# Patient Record
Sex: Female | Born: 1955 | Race: White | Hispanic: No | Smoking: Former smoker
Health system: Southern US, Community
[De-identification: ages and names within clinical notes are randomized; demographics above are authoritative.]

## PROBLEM LIST (undated history)

## (undated) DIAGNOSIS — N6459 Other signs and symptoms in breast: Secondary | ICD-10-CM

## (undated) DIAGNOSIS — T7840XA Allergy, unspecified, initial encounter: Secondary | ICD-10-CM

## (undated) DIAGNOSIS — D649 Anemia, unspecified: Secondary | ICD-10-CM

## (undated) DIAGNOSIS — I10 Essential (primary) hypertension: Secondary | ICD-10-CM

## (undated) HISTORY — PX: OTHER SURGICAL HISTORY: SHX169

## (undated) HISTORY — DX: Anemia, unspecified: D64.9

## (undated) HISTORY — DX: Allergy, unspecified, initial encounter: T78.40XA

---

## 1997-03-18 ENCOUNTER — Ambulatory Visit (HOSPITAL_COMMUNITY): Admission: RE | Admit: 1997-03-18 | Discharge: 1997-03-18 | Payer: Self-pay | Admitting: Obstetrics and Gynecology

## 1998-04-09 ENCOUNTER — Encounter: Payer: Self-pay | Admitting: Obstetrics and Gynecology

## 1998-04-09 ENCOUNTER — Ambulatory Visit (HOSPITAL_COMMUNITY): Admission: RE | Admit: 1998-04-09 | Discharge: 1998-04-09 | Payer: Self-pay | Admitting: Obstetrics and Gynecology

## 1999-04-17 ENCOUNTER — Encounter: Admission: RE | Admit: 1999-04-17 | Discharge: 1999-04-17 | Payer: Self-pay | Admitting: Obstetrics and Gynecology

## 1999-04-17 ENCOUNTER — Encounter: Payer: Self-pay | Admitting: Obstetrics and Gynecology

## 2000-06-17 ENCOUNTER — Encounter: Admission: RE | Admit: 2000-06-17 | Discharge: 2000-06-17 | Payer: Self-pay | Admitting: Obstetrics and Gynecology

## 2000-06-17 ENCOUNTER — Encounter: Payer: Self-pay | Admitting: Obstetrics and Gynecology

## 2000-07-22 ENCOUNTER — Encounter: Admission: RE | Admit: 2000-07-22 | Discharge: 2000-07-22 | Payer: Self-pay | Admitting: Obstetrics and Gynecology

## 2000-07-22 ENCOUNTER — Encounter: Payer: Self-pay | Admitting: Obstetrics and Gynecology

## 2000-07-22 ENCOUNTER — Other Ambulatory Visit: Admission: RE | Admit: 2000-07-22 | Discharge: 2000-07-22 | Payer: Self-pay | Admitting: Radiology

## 2000-07-22 HISTORY — PX: BREAST CYST ASPIRATION: SHX578

## 2001-01-30 ENCOUNTER — Encounter: Payer: Self-pay | Admitting: Obstetrics and Gynecology

## 2001-01-30 ENCOUNTER — Encounter: Admission: RE | Admit: 2001-01-30 | Discharge: 2001-01-30 | Payer: Self-pay | Admitting: Obstetrics and Gynecology

## 2001-08-11 ENCOUNTER — Encounter: Admission: RE | Admit: 2001-08-11 | Discharge: 2001-08-11 | Payer: Self-pay | Admitting: Obstetrics and Gynecology

## 2001-08-11 ENCOUNTER — Encounter: Payer: Self-pay | Admitting: Obstetrics and Gynecology

## 2002-01-25 ENCOUNTER — Other Ambulatory Visit: Admission: RE | Admit: 2002-01-25 | Discharge: 2002-01-25 | Payer: Self-pay | Admitting: Obstetrics and Gynecology

## 2002-08-17 ENCOUNTER — Encounter: Admission: RE | Admit: 2002-08-17 | Discharge: 2002-08-17 | Payer: Self-pay | Admitting: Obstetrics and Gynecology

## 2002-08-17 ENCOUNTER — Encounter: Payer: Self-pay | Admitting: Obstetrics and Gynecology

## 2003-09-13 ENCOUNTER — Encounter: Admission: RE | Admit: 2003-09-13 | Discharge: 2003-09-13 | Payer: Self-pay | Admitting: Obstetrics and Gynecology

## 2004-03-06 ENCOUNTER — Encounter: Admission: RE | Admit: 2004-03-06 | Discharge: 2004-03-06 | Payer: Self-pay | Admitting: Obstetrics and Gynecology

## 2004-09-25 ENCOUNTER — Encounter: Admission: RE | Admit: 2004-09-25 | Discharge: 2004-09-25 | Payer: Self-pay | Admitting: Obstetrics and Gynecology

## 2005-02-19 ENCOUNTER — Encounter (INDEPENDENT_AMBULATORY_CARE_PROVIDER_SITE_OTHER): Payer: Self-pay | Admitting: *Deleted

## 2005-02-19 ENCOUNTER — Ambulatory Visit (HOSPITAL_COMMUNITY): Admission: RE | Admit: 2005-02-19 | Discharge: 2005-02-19 | Payer: Self-pay | Admitting: Obstetrics and Gynecology

## 2005-10-08 ENCOUNTER — Encounter: Admission: RE | Admit: 2005-10-08 | Discharge: 2005-10-08 | Payer: Self-pay | Admitting: Obstetrics and Gynecology

## 2006-10-14 ENCOUNTER — Encounter: Admission: RE | Admit: 2006-10-14 | Discharge: 2006-10-14 | Payer: Self-pay | Admitting: Obstetrics & Gynecology

## 2007-10-20 ENCOUNTER — Encounter: Admission: RE | Admit: 2007-10-20 | Discharge: 2007-10-20 | Payer: Self-pay | Admitting: Obstetrics & Gynecology

## 2008-10-30 ENCOUNTER — Encounter: Admission: RE | Admit: 2008-10-30 | Discharge: 2008-10-30 | Payer: Self-pay | Admitting: Obstetrics & Gynecology

## 2009-11-18 ENCOUNTER — Encounter: Admission: RE | Admit: 2009-11-18 | Discharge: 2009-11-18 | Payer: Self-pay | Admitting: Obstetrics & Gynecology

## 2010-05-29 NOTE — Op Note (Signed)
NAMESUMMERLYNN, Audrey Glover         ACCOUNT NO.:  [id number]   MEDICAL RECORD NO.:  [id number]          PATIENT TYPE:  AMB   LOCATION:  SDC                           FACILITY:  [facility]   PHYSICIAN:  Richardean Sale, M.D.   DATE OF BIRTH:  [birth year]   DATE OF PROCEDURE:  02/20/2005  DATE OF DISCHARGE:                                 OPERATIVE REPORT   PREOPERATIVE DIAGNOSES:  1.  Menorrhagia.  2.  Submucosal fibroid.   POSTOPERATIVE DIAGNOSES:  1.  Menorrhagia.  2.  Submucosal fibroid.  3.  Endometrial polyp.   OPERATION/PROCEDURE:  1.  Hysteroscopy.  2.  Dilatation and curettage.  3.  Resection of endometrial polyp and submucosal fibroid.   SURGEON:  Richardean Sale, M.D.   ASSISTANT:  None.   ANESTHESIA:  Conscious sedation with paracervical block.   ESTIMATED BLOOD LOSS:  Minimal.   FLUID DEFICIT:  80 mL.   FINDINGS:  Enlarged intracavitary submucosal fibroid approximately 2.5 cm in  diameter and a small, less than 0.5 cm endometrial polyp originating from  the right cornua.  Uterine sound length of 8 cm.   SPECIMENS:  Endometrial polyp, submucosal fibroid and endometrial curettings  sent to pathology.   INDICATIONS:  This is a 55 year old white female who has a progressive  history of worsening menses with increase in flow.  The patient was found to  be anemic secondary to her menstrual flow.  Ultrasound showed an  intracavitary submucosal fibroid.  The patient was placed on Lupron for  three months in an effort to increase her hgb and presented today for  hysteroscopy and dilatation and curettage with removal of the submucosal  fibroids.  Prior to the receipt of the risks, benefits and alternatives of  the procedure reviewed with the patient in detail, we discussed risks which  include but are not limited to hemorrhage requiring transfusion, infection,  injury to the uterus, such as uterine perforation which could cause injury  to the bowel or bladder which would  require additional surgery, risks of  anesthesia, DVT, possibility requiring hysterectomy in the future.  Also the  risk of fluid overload and electrolyte imbalance.  The patient voiced  understanding of all these risks and agreed to proceed.  Informed consent  was obtained before proceeding to the OR.   DESCRIPTION OF PROCEDURE:  The patient was taken to the operating room where  she was given conscious sedation.  She was then prepped and draped in the  usual sterile fashion and placed in the dorsal lithotomy position.  Bimanual  exam was performed.  The uterus was midline, mobile, nontender, normal size.  Adnexa were normal.  Red rubber catheter was then used to drain the bladder.  A speculum was placed in the vagina and the cervix was grasped with a single-  tooth tenaculum.  A paracervical block was then administered using a total  of 20 mL of 1% Nesacaine.  The uterus then sounded to 8 cm.  The cervix was  then very gently dilated up to 31-French and the resectoscope was then  introduced.  Inspection of the uterine cavity revealed the findings  as noted  above.  The double-loop wire was used to resect the fibroid in multiple  fragments. These were removed and sent to pathology labeled as uterine  fibroid.  The endometrial polyp was noted to originate from the right  cornua. This was removed also using the resectoscope and was sent to  pathology labeled as endometrial polyp.  Once resection was complete, the  hysteroscope was removed. This was followed by curettage to remove any  additional loose fragments and this specimen was then sent to pathology and  labeled as endometrial curettings.  The hysteroscope was then reintroduced.  There was no evidence of any active bleeding and the uterine cavity was now  clear of any fibroid or polyp fragments.  At this point the procedure was  then terminated.  The single-tooth tenaculum was removed.  There was no  bleeding coming from the tenaculum  site.  There was minimal bleeding coming  from the cervical os.  Speculum and all instruments were removed from the  patient's vagina.  The patient tolerated the procedure very well.  She was  taken out of the dorsal lithotomy position and was taken to the recovery  room awake and in stable condition.  There were no complications.  All  sponge, lap, needle, and instrument counts were correct x2.      Richardean Sale, M.D.  Electronically Signed     JW/MEDQ  D:  02/19/2005  T:  02/20/2005  Job:  [id number]

## 2010-12-01 ENCOUNTER — Other Ambulatory Visit: Payer: Self-pay | Admitting: Obstetrics & Gynecology

## 2010-12-01 DIAGNOSIS — Z1231 Encounter for screening mammogram for malignant neoplasm of breast: Secondary | ICD-10-CM

## 2010-12-11 ENCOUNTER — Other Ambulatory Visit: Payer: Self-pay | Admitting: Obstetrics & Gynecology

## 2010-12-11 ENCOUNTER — Ambulatory Visit
Admission: RE | Admit: 2010-12-11 | Discharge: 2010-12-11 | Disposition: A | Discharge status: Home / Self Care | Payer: Managed Care, Other (non HMO) | Source: Ambulatory Visit | Attending: Obstetrics & Gynecology | Admitting: Obstetrics & Gynecology

## 2010-12-11 DIAGNOSIS — Z1231 Encounter for screening mammogram for malignant neoplasm of breast: Secondary | ICD-10-CM

## 2010-12-11 DIAGNOSIS — Z79890 Hormone replacement therapy (postmenopausal): Secondary | ICD-10-CM

## 2010-12-16 ENCOUNTER — Other Ambulatory Visit: Payer: Self-pay | Admitting: Obstetrics & Gynecology

## 2010-12-16 DIAGNOSIS — R928 Other abnormal and inconclusive findings on diagnostic imaging of breast: Secondary | ICD-10-CM

## 2010-12-30 ENCOUNTER — Ambulatory Visit
Admission: RE | Admit: 2010-12-30 | Discharge: 2010-12-30 | Disposition: A | Discharge status: Home / Self Care | Payer: Managed Care, Other (non HMO) | Source: Ambulatory Visit | Attending: Obstetrics & Gynecology | Admitting: Obstetrics & Gynecology

## 2010-12-30 DIAGNOSIS — R928 Other abnormal and inconclusive findings on diagnostic imaging of breast: Secondary | ICD-10-CM

## 2011-01-07 ENCOUNTER — Inpatient Hospital Stay: Admission: RE | Admit: 2011-01-07 | Payer: Managed Care, Other (non HMO) | Source: Ambulatory Visit

## 2011-01-10 ENCOUNTER — Ambulatory Visit (INDEPENDENT_AMBULATORY_CARE_PROVIDER_SITE_OTHER): Payer: Managed Care, Other (non HMO)

## 2011-01-10 DIAGNOSIS — R509 Fever, unspecified: Secondary | ICD-10-CM

## 2011-01-10 DIAGNOSIS — R05 Cough: Secondary | ICD-10-CM

## 2011-12-31 ENCOUNTER — Other Ambulatory Visit: Payer: Self-pay | Admitting: Obstetrics & Gynecology

## 2011-12-31 DIAGNOSIS — Z1231 Encounter for screening mammogram for malignant neoplasm of breast: Secondary | ICD-10-CM

## 2012-01-27 ENCOUNTER — Ambulatory Visit
Admission: RE | Admit: 2012-01-27 | Discharge: 2012-01-27 | Disposition: A | Discharge status: Home / Self Care | Payer: Private Health Insurance - Indemnity | Source: Ambulatory Visit | Attending: Obstetrics & Gynecology | Admitting: Obstetrics & Gynecology

## 2012-01-27 DIAGNOSIS — Z1231 Encounter for screening mammogram for malignant neoplasm of breast: Secondary | ICD-10-CM

## 2012-08-11 ENCOUNTER — Ambulatory Visit (INDEPENDENT_AMBULATORY_CARE_PROVIDER_SITE_OTHER): Payer: Managed Care, Other (non HMO) | Admitting: Physician Assistant

## 2012-08-11 VITALS — BP 146/94 | HR 83 | Temp 98.1°F | Resp 16 | Ht 65.5 in | Wt 149.8 lb

## 2012-08-11 DIAGNOSIS — J329 Chronic sinusitis, unspecified: Secondary | ICD-10-CM

## 2012-08-11 DIAGNOSIS — R0981 Nasal congestion: Secondary | ICD-10-CM

## 2012-08-11 DIAGNOSIS — J3489 Other specified disorders of nose and nasal sinuses: Secondary | ICD-10-CM

## 2012-08-11 MED ORDER — AMOXICILLIN-POT CLAVULANATE 875-125 MG PO TABS: 1.0000 | ORAL_TABLET | Freq: Two times a day (BID) | ORAL | Status: DC

## 2012-08-11 MED ORDER — FLUTICASONE PROPIONATE 50 MCG/ACT NA SUSP: 2.0000 | Freq: Every day | NASAL | Status: DC

## 2012-08-11 NOTE — Progress Notes (Signed)
  Subjective:    Patient ID: Audrey Glover, female    DOB: [birth year], 57 y.o.   MRN: [id number]  HPI 57 year old female presents with 1 month hx of sinus congestion, pain, headache, thick nasal discharge, PND, and slight dry cough.  States symptoms have progressively worsened and she has now developed. Has hx of sinus infections in the past treated successfully with Augmentin and Flonase. Admits she usually has troubles in January and the end of the summer.  Did not have an infection this past January because she was using Flonase prn which seemed to help.  Denies SOB, wheezing, hemoptysis, fever, nausea, vomiting, dizziness, otalgia, or sore throat. Has had chills and fatigue.   Patient is otherwise doing well with no other concerns today.     Review of Systems  Constitutional: Positive for chills and fatigue. Negative for fever.  HENT: Positive for congestion, rhinorrhea, postnasal drip and sinus pressure. Negative for ear pain and sore throat.   Respiratory: Positive for cough. Negative for shortness of breath and wheezing.   Gastrointestinal: Negative for nausea, vomiting and abdominal pain.  Neurological: Positive for headaches.       Objective:   Physical Exam  Constitutional: She is oriented to person, place, and time. She appears well-developed and well-nourished.  HENT:  Head: Normocephalic and atraumatic.  Right Ear: Hearing, tympanic membrane, external ear and ear canal normal.  Left Ear: Hearing, tympanic membrane, external ear and ear canal normal.  Nose: Right sinus exhibits maxillary sinus tenderness. Right sinus exhibits no frontal sinus tenderness. Left sinus exhibits maxillary sinus tenderness. Left sinus exhibits no frontal sinus tenderness.  Mouth/Throat: Uvula is midline, oropharynx is clear and moist and mucous membranes are normal.  Eyes: Conjunctivae are normal.  Neck: Normal range of motion. Neck supple.  Cardiovascular: Normal rate, regular rhythm and  normal heart sounds.   Pulmonary/Chest: Effort normal and breath sounds normal.  Lymphadenopathy:    She has no cervical adenopathy.  Neurological: She is alert and oriented to person, place, and time.  Psychiatric: She has a normal mood and affect. Her behavior is normal. Judgment and thought content normal.          Assessment & Plan:  Sinusitis - Plan: amoxicillin-clavulanate (AUGMENTIN) 875-125 MG per tablet, fluticasone (FLONASE) 50 MCG/ACT nasal spray  Nasal congestion - Plan: fluticasone (FLONASE) 50 MCG/ACT nasal spray  Will treat with Augmentin 875 mg bid x 10 days Flonase twice daily to help with PND and nasal congestion. Refills to use prn  Mucinex as directed Follow up if symptoms worsen or fail to improve.

## 2012-08-15 ENCOUNTER — Emergency Department (HOSPITAL_COMMUNITY)
Admission: EM | Admit: 2012-08-15 | Discharge: 2012-08-16 | Disposition: A | Discharge status: Home / Self Care | Payer: Private Health Insurance - Indemnity | Attending: Emergency Medicine | Admitting: Emergency Medicine

## 2012-08-15 ENCOUNTER — Encounter (HOSPITAL_COMMUNITY): Payer: Self-pay | Admitting: Emergency Medicine

## 2012-08-15 DIAGNOSIS — R0609 Other forms of dyspnea: Secondary | ICD-10-CM | POA: Insufficient documentation

## 2012-08-15 DIAGNOSIS — Z79899 Other long term (current) drug therapy: Secondary | ICD-10-CM | POA: Insufficient documentation

## 2012-08-15 DIAGNOSIS — R0989 Other specified symptoms and signs involving the circulatory and respiratory systems: Secondary | ICD-10-CM | POA: Insufficient documentation

## 2012-08-15 DIAGNOSIS — I10 Essential (primary) hypertension: Secondary | ICD-10-CM

## 2012-08-15 DIAGNOSIS — R0789 Other chest pain: Secondary | ICD-10-CM | POA: Insufficient documentation

## 2012-08-15 DIAGNOSIS — R059 Cough, unspecified: Secondary | ICD-10-CM | POA: Insufficient documentation

## 2012-08-15 DIAGNOSIS — Z87891 Personal history of nicotine dependence: Secondary | ICD-10-CM | POA: Insufficient documentation

## 2012-08-15 DIAGNOSIS — R05 Cough: Secondary | ICD-10-CM | POA: Insufficient documentation

## 2012-08-15 DIAGNOSIS — F411 Generalized anxiety disorder: Secondary | ICD-10-CM | POA: Insufficient documentation

## 2012-08-15 DIAGNOSIS — IMO0002 Reserved for concepts with insufficient information to code with codable children: Secondary | ICD-10-CM | POA: Insufficient documentation

## 2012-08-15 NOTE — ED Notes (Signed)
Pt states starting last week she was not feeling right, cold sxs and feeling lethargic.Micah Flesher to urgent care and was given amoxicillin and nasal spray  Pt states she noticed her blood pressure was elevated at that time  Pt states since last Friday she has noticed it has been slightly elevated as she has been keeping track of it   Pt states tonight she feels heavy in her chest and has been coughing a lot so she checked her pressure at home and it was 116 diastolic  In triage B/P 176/110  EKG performed in triage

## 2012-08-16 ENCOUNTER — Emergency Department (HOSPITAL_COMMUNITY): Payer: Private Health Insurance - Indemnity

## 2012-08-16 LAB — CBC WITH DIFFERENTIAL/PLATELET
Eosinophils Absolute: 0.1 10*3/uL (ref 0.0–0.7)
Hemoglobin: 14.6 g/dL (ref 12.0–15.0)
Lymphocytes Relative: 32 % (ref 12–46)
Lymphs Abs: 1.9 10*3/uL (ref 0.7–4.0)
MCH: 31.5 pg (ref 26.0–34.0)
Monocytes Relative: 10 % (ref 3–12)
Neutro Abs: 3.3 10*3/uL (ref 1.7–7.7)
Neutrophils Relative %: 57 % (ref 43–77)
RBC: 4.64 MIL/uL (ref 3.87–5.11)

## 2012-08-16 LAB — BASIC METABOLIC PANEL
GFR calc non Af Amer: 63 mL/min — ABNORMAL LOW (ref >90)
Glucose, Bld: 94 mg/dL (ref 70–99)
Potassium: 3.6 mEq/L (ref 3.5–5.1)
Sodium: 141 mEq/L (ref 135–145)

## 2012-08-16 MED ORDER — LISINOPRIL 10 MG PO TABS
10.0000 mg | ORAL_TABLET | Freq: Once | ORAL | Status: AC
  Administered 2012-08-16: 10 mg via ORAL
  Filled 2012-08-16: qty 1

## 2012-08-16 MED ORDER — LISINOPRIL 10 MG PO TABS: 10.0000 mg | ORAL_TABLET | Freq: Every day | ORAL | Status: DC

## 2012-08-16 NOTE — ED Provider Notes (Signed)
CSN: [id number]     Arrival date & time 08/15/12  2318 History     First MD Initiated Contact with Patient 08/15/12 2349     Chief Complaint  Patient presents with  . Hypertension   (Consider location/radiation/quality/duration/timing/severity/associated sxs/prior Treatment) HPI 57 yo female presents to the ER with high blood pressure.  She denies prior h/o same, but does have family history of HTN in both parents.  Pt was seen last week for sinus pressure and malaise, cold sxs.  She had slightly elevated bp 140s/90s during that visit at urgent care.  She was given rx for augmentin and flonase.  She reports the sinus headaches have resolved, but she still has dry cough, very mild dyspnea with exertion, chest tightness.  She has been checking her BP 4 times a day, and has had values for 140-150s/80-100.  Tonight BP was 170/116 in triage.  Pt admits to being anxious about her bp being high.  No leg swelling, no orthopnea, PND, chest pain, headache, visual changes.  No decongestants.   History reviewed. No pertinent past medical history. Past Surgical History  Procedure Laterality Date  . Uterine fibroid removed     Family History  Problem Relation Age of Onset  . Hypertension Mother   . Heart disease Father   . Hypertension Father    History  Substance Use Topics  . Smoking status: Former Games developer  . Smokeless tobacco: Not on file  . Alcohol Use: 1.2 oz/week    2 Glasses of wine per week   OB History   Grav Para Term Preterm Abortions TAB SAB Ect Mult Living                 Review of Systems  All other systems reviewed and are negative.    Allergies  Sulfa antibiotics  Home Medications   Current Outpatient Rx  Name  Route  Sig  Dispense  Refill  . acidophilus (RISAQUAD) CAPS capsule   Oral   Take 1 capsule by mouth every morning.         Marland Kitchen amoxicillin-clavulanate (AUGMENTIN) 875-125 MG per tablet   Oral   Take 1 tablet by mouth 2 (two) times daily.         . B  Complex-C (B-COMPLEX WITH VITAMIN C) tablet   Oral   Take 1 tablet by mouth every morning.         . calcium carbonate (OS-CAL) 600 MG TABS tablet   Oral   Take 600 mg by mouth every morning.         . fish oil-omega-3 fatty acids 1000 MG capsule   Oral   Take 1 g by mouth every morning.         . fluticasone (FLONASE) 50 MCG/ACT nasal spray   Nasal   Place 2 sprays into the nose daily.   16 g   5   . loratadine (CLARITIN) 10 MG tablet   Oral   Take 10 mg by mouth daily as needed for allergies.         . Multiple Vitamin (MULTIVITAMIN) tablet   Oral   Take 1 tablet by mouth every morning.          . ranitidine (ZANTAC) 75 MG tablet   Oral   Take 75 mg by mouth every morning.         Marland Kitchen lisinopril (PRINIVIL,ZESTRIL) 10 MG tablet   Oral   Take 1 tablet (10 mg total) by mouth daily.  30 tablet   0    BP 138/97  Pulse 88  Temp(Src) 98.5 F (36.9 C) (Oral)  Resp 15  Ht 5\' 5"  (1.651 m)  Wt 149 lb (67.586 kg)  BMI 24.79 kg/m2  SpO2 97% Physical Exam  Nursing note and vitals reviewed. Constitutional: She is oriented to person, place, and time. She appears well-developed and well-nourished.  HENT:  Head: Normocephalic and atraumatic.  Right Ear: External ear normal.  Left Ear: External ear normal.  Nose: Nose normal.  Mouth/Throat: Oropharynx is clear and moist.  Eyes: Conjunctivae and EOM are normal. Pupils are equal, round, and reactive to light.  Neck: Normal range of motion. Neck supple. No JVD present. No tracheal deviation present. No thyromegaly present.  Cardiovascular: Normal rate, regular rhythm, normal heart sounds and intact distal pulses.  Exam reveals no gallop and no friction rub.   No murmur heard. Pulmonary/Chest: Effort normal and breath sounds normal. No stridor. No respiratory distress. She has no wheezes. She has no rales. She exhibits no tenderness.  Abdominal: Soft. Bowel sounds are normal. She exhibits no distension and no mass.  There is no tenderness. There is no rebound and no guarding.  Musculoskeletal: Normal range of motion. She exhibits no edema and no tenderness.  Lymphadenopathy:    She has no cervical adenopathy.  Neurological: She is alert and oriented to person, place, and time. She has normal reflexes. No cranial nerve deficit. She exhibits normal muscle tone. Coordination normal.  Skin: Skin is warm and dry. No rash noted. No erythema. No pallor.  Psychiatric: She has a normal mood and affect. Her behavior is normal. Judgment and thought content normal.    ED Course   Procedures (including critical care time)  Labs Reviewed  BASIC METABOLIC PANEL - Abnormal; Notable for the following:    GFR calc non Af Amer 63 (*)    GFR calc Af Amer 73 (*)    All other components within normal limits  CBC WITH DIFFERENTIAL  URINALYSIS, ROUTINE W REFLEX MICROSCOPIC   Dg Chest 2 View  08/16/2012   *RADIOLOGY REPORT*  Clinical Data: Chest pain and pressure.  Cough.  CHEST - 2 VIEW  Comparison: None.  Findings: Lungs are clear.  Heart size is normal.  No pneumothorax or pleural fluid.  No focal bony abnormality.  IMPRESSION: Negative chest.   Original Report Authenticated By: Holley Dexter, M.D.    Date: 08/16/2012  Rate: 97  Rhythm: normal sinus rhythm  QRS Axis: left  Intervals: normal  ST/T Wave abnormalities: normal  Conduction Disutrbances:none  Narrative Interpretation:   Old EKG Reviewed: none available   1. Hypertension     MDM  57 yo female with htn x 1 week.  Will start on lisinopril given her elevated values in her log.  Pt instructed to f/u with pcm for recheck in 1 week with BMP.  No signs of end organ damage on exam or workup  Olivia Mackie, MD 08/16/12 [phone]

## 2012-09-09 ENCOUNTER — Encounter: Payer: Self-pay | Admitting: Emergency Medicine

## 2012-09-09 ENCOUNTER — Emergency Department
Admission: EM | Admit: 2012-09-09 | Discharge: 2012-09-09 | Disposition: A | Discharge status: Home / Self Care | Payer: Private Health Insurance - Indemnity | Source: Home / Self Care | Attending: Family Medicine | Admitting: Family Medicine

## 2012-09-09 DIAGNOSIS — I10 Essential (primary) hypertension: Secondary | ICD-10-CM

## 2012-09-09 HISTORY — DX: Essential (primary) hypertension: I10

## 2012-09-09 MED ORDER — LISINOPRIL 10 MG PO TABS: 10.0000 mg | ORAL_TABLET | Freq: Every day | ORAL | Status: DC

## 2012-09-09 NOTE — ED Provider Notes (Signed)
CSN: [id number]     Arrival date & time 09/09/12  1115 History   First MD Initiated Contact with Patient 09/09/12 1255     Chief Complaint  Patient presents with  . Hypertension      HPI Comments: Patient is newly diagnosed with hypertension.  She was started on lisinopril 10mg , and has a 30 day prescription until she can establish care with a PCP. Needs renewal of rx to last until 10/04/2012; took last dose last night. She has had no adverse effect from lisinopril, and has been compliant.  She states that she has a mild cough, but had a cold at the time she was evaluated for hypertension.  She admits that her initial BP was approximately 170/118, and after starting lisinopril her pressures have averaged 115 to 130/70 to 80. She denies chest pain or shortness of breath.  No dependent edema.  Patient is a 57 y.o. female presenting with hypertension. The history is provided by the patient.  Hypertension This is a new problem. The current episode started more than 1 week ago. The problem occurs constantly. The problem has been gradually improving. Pertinent negatives include no chest pain, no headaches and no shortness of breath. Nothing aggravates the symptoms. Relieved by: lisinopril.    Past Medical History  Diagnosis Date  . Hypertension    Past Surgical History  Procedure Laterality Date  . Uterine fibroid removed     Family History  Problem Relation Age of Onset  . Hypertension Mother   . Heart disease Father   . Hypertension Father    History  Substance Use Topics  . Smoking status: Former Games developer  . Smokeless tobacco: Not on file  . Alcohol Use: 1.2 oz/week    2 Glasses of wine per week   OB History   Grav Para Term Preterm Abortions TAB SAB Ect Mult Living                 Review of Systems  Respiratory: Negative for shortness of breath.   Cardiovascular: Negative for chest pain.  Neurological: Negative for headaches.  All other systems reviewed and are  negative.    Allergies  Sulfa antibiotics  Home Medications   Current Outpatient Rx  Name  Route  Sig  Dispense  Refill  . acidophilus (RISAQUAD) CAPS capsule   Oral   Take 1 capsule by mouth every morning.         . B Complex-C (B-COMPLEX WITH VITAMIN C) tablet   Oral   Take 1 tablet by mouth every morning.         . calcium carbonate (OS-CAL) 600 MG TABS tablet   Oral   Take 600 mg by mouth every morning.         . fish oil-omega-3 fatty acids 1000 MG capsule   Oral   Take 1 g by mouth every morning.         . fluticasone (FLONASE) 50 MCG/ACT nasal spray   Nasal   Place 2 sprays into the nose daily.   16 g   5   . lisinopril (PRINIVIL,ZESTRIL) 10 MG tablet   Oral   Take 1 tablet (10 mg total) by mouth daily.   30 tablet   0   . loratadine (CLARITIN) 10 MG tablet   Oral   Take 10 mg by mouth daily as needed for allergies.         . Multiple Vitamin (MULTIVITAMIN) tablet   Oral   Take  1 tablet by mouth every morning.          . ranitidine (ZANTAC) 75 MG tablet   Oral   Take 75 mg by mouth every morning.          BP 131/85  Pulse 100  Temp(Src) 97.7 F (36.5 C) (Oral)  Resp 16  Ht 5\' 5"  (1.651 m)  Wt 149 lb (67.586 kg)  BMI 24.79 kg/m2  SpO2 100% Physical Exam Nursing notes and Vital Signs reviewed. Appearance:  Patient appears healthy, stated age, and in no acute distress Eyes:  Pupils are equal, round, and reactive to light and accomodation.  Extraocular movement is intact.  Conjunctivae are not inflamed.  Fundi benign   Pharynx:  Normal Neck:  Supple.   No adenopathy or thyromegaly.  Carotids have normal upstrokes without bruits Lungs:  Clear to auscultation.  Breath sounds are equal.  Heart:  Regular rate and rhythm without murmurs, rubs, or gallops.  Abdomen:  Nontender without masses or hepatosplenomegaly.  Bowel sounds are present.  No CVA or flank tenderness.  Extremities:  No edema.  No calf tenderness Skin:  No rash  present.   ED Course  Procedures  none        MDM   1. Essential hypertension, benign; improved control    Continue Lisinopril 10mg  daily, Rx #30.  Followup with PCP    Lattie Haw, MD 09/11/12 1045

## 2012-09-09 NOTE — ED Notes (Signed)
Patient is newly diagnosed with hypertension and on a 30 day rx until can be established with PCP. Needs renewal of rx to last until 10/04/2012; took last dose last night.

## 2012-10-03 ENCOUNTER — Telehealth: Payer: Self-pay

## 2012-10-03 NOTE — Telephone Encounter (Signed)
Mobile # non working LM on work VM to [employer] HM overdue: PAP, Colonoscopy, Flu vaccine Mammogram current 01/17/2012 EKG 08/17/2012

## 2012-10-04 ENCOUNTER — Encounter: Payer: Self-pay | Admitting: Family Medicine

## 2012-10-04 ENCOUNTER — Ambulatory Visit (INDEPENDENT_AMBULATORY_CARE_PROVIDER_SITE_OTHER): Payer: Private Health Insurance - Indemnity | Admitting: Family Medicine

## 2012-10-04 VITALS — BP 122/84 | HR 97 | Temp 98.5°F | Ht 65.0 in | Wt 148.4 lb

## 2012-10-04 DIAGNOSIS — I10 Essential (primary) hypertension: Secondary | ICD-10-CM

## 2012-10-04 DIAGNOSIS — Z0000 Encounter for general adult medical examination without abnormal findings: Secondary | ICD-10-CM | POA: Insufficient documentation

## 2012-10-04 DIAGNOSIS — Z23 Encounter for immunization: Secondary | ICD-10-CM

## 2012-10-04 LAB — BASIC METABOLIC PANEL
CO2: 28 mEq/L (ref 19–32)
Chloride: 105 mEq/L (ref 96–112)
Glucose, Bld: 84 mg/dL (ref 70–99)
Potassium: 4.2 mEq/L (ref 3.5–5.1)
Sodium: 138 mEq/L (ref 135–145)

## 2012-10-04 LAB — LIPID PANEL: Total CHOL/HDL Ratio: 4

## 2012-10-04 LAB — HEPATIC FUNCTION PANEL
AST: 17 U/L (ref 0–37)
Albumin: 4 g/dL (ref 3.5–5.2)
Alkaline Phosphatase: 49 U/L (ref 39–117)
Total Protein: 6.7 g/dL (ref 6.0–8.3)

## 2012-10-04 LAB — LDL CHOLESTEROL, DIRECT: Direct LDL: 133.6 mg/dL

## 2012-10-04 MED ORDER — LISINOPRIL 10 MG PO TABS: 10.0000 mg | ORAL_TABLET | Freq: Every day | ORAL | Status: DC

## 2012-10-04 NOTE — Progress Notes (Signed)
  Subjective:    Patient ID: Audrey Glover, female    DOB: 04/24/1955, 57 y.o.   MRN: [id number]  HPI New.  GYN- [facility]  UTD on colonoscopy, mammo.  Due for DEXA and pap.  CPE- no concerns   Review of Systems Patient reports no vision/ hearing changes, adenopathy,fever, weight change,  persistant/recurrent hoarseness , swallowing issues, chest pain, palpitations, edema, persistant/recurrent cough, hemoptysis, dyspnea (rest/exertional/paroxysmal nocturnal), gastrointestinal bleeding (melena, rectal bleeding), abdominal pain, significant heartburn, bowel changes, GU symptoms (dysuria, hematuria, incontinence), Gyn symptoms (abnormal  bleeding, pain),  syncope, focal weakness, memory loss, numbness & tingling, skin/hair/nail changes, abnormal bruising or bleeding, anxiety, or depression.     Objective:   Physical Exam General Appearance:    Alert, cooperative, no distress, appears stated age  Head:    Normocephalic, without obvious abnormality, atraumatic  Eyes:    PERRL, conjunctiva/corneas clear, EOM's intact, fundi    benign, both eyes  Ears:    Normal TM's and external ear canals, both ears  Nose:   Nares normal, septum midline, mucosa normal, no drainage    or sinus tenderness  Throat:   Lips, mucosa, and tongue normal; teeth and gums normal  Neck:   Supple, symmetrical, trachea midline, no adenopathy;    Thyroid: no enlargement/tenderness/nodules  Back:     Symmetric, no curvature, ROM normal, no CVA tenderness  Lungs:     Clear to auscultation bilaterally, respirations unlabored  Chest Wall:    No tenderness or deformity   Heart:    Regular rate and rhythm, S1 and S2 normal, no murmur, rub   or gallop  Breast Exam:    Deferred to GYN  Abdomen:     Soft, non-tender, bowel sounds active all four quadrants,    no masses, no organomegaly  Genitalia:    Deferred to GYN  Rectal:    Extremities:   Extremities normal, atraumatic, no cyanosis or edema  Pulses:   2+ and symmetric  all extremities  Skin:   Skin color, texture, turgor normal, no rashes or lesions  Lymph nodes:   Cervical, supraclavicular, and axillary nodes normal  Neurologic:   CNII-XII intact, normal strength, sensation and reflexes    throughout          Assessment & Plan:

## 2012-10-04 NOTE — Patient Instructions (Addendum)
Follow up in 6 months to recheck blood pressure Continue the Lisinopril- it looks great! We'll notify you of your lab results and make any changes if needed Keep up the good work!  You look fantastic! Call and schedule with GYN [facility]!  We're glad to have you! Happy Fall!!!

## 2012-10-04 NOTE — Assessment & Plan Note (Signed)
Pt's PE WNL.  Pt due for GYN exam- plans to schedule.  Check labs.  Anticipatory guidance provided.

## 2012-10-04 NOTE — Telephone Encounter (Signed)
Unable to reach for Pre Visit Planning 

## 2012-10-09 LAB — VITAMIN D 1,25 DIHYDROXY
Vitamin D 1, 25 (OH)2 Total: 30 pg/mL (ref 18–72)
Vitamin D3 1, 25 (OH)2: 30 pg/mL

## 2012-10-11 LAB — HM PAP SMEAR: HM Pap smear: NORMAL

## 2013-02-14 ENCOUNTER — Other Ambulatory Visit: Payer: Self-pay

## 2013-02-14 DIAGNOSIS — Z1231 Encounter for screening mammogram for malignant neoplasm of breast: Secondary | ICD-10-CM

## 2013-03-02 ENCOUNTER — Ambulatory Visit
Admission: RE | Admit: 2013-03-02 | Discharge: 2013-03-02 | Disposition: A | Discharge status: Home / Self Care | Payer: Managed Care, Other (non HMO) | Source: Ambulatory Visit

## 2013-03-02 DIAGNOSIS — Z1231 Encounter for screening mammogram for malignant neoplasm of breast: Secondary | ICD-10-CM

## 2013-09-20 ENCOUNTER — Encounter: Payer: Self-pay | Admitting: Family Medicine

## 2013-09-20 ENCOUNTER — Ambulatory Visit (INDEPENDENT_AMBULATORY_CARE_PROVIDER_SITE_OTHER): Payer: Managed Care, Other (non HMO) | Admitting: Family Medicine

## 2013-09-20 VITALS — BP 120/76 | HR 72 | Temp 98.6°F | Resp 16 | Wt 145.2 lb

## 2013-09-20 DIAGNOSIS — Z23 Encounter for immunization: Secondary | ICD-10-CM

## 2013-09-20 DIAGNOSIS — I10 Essential (primary) hypertension: Secondary | ICD-10-CM

## 2013-09-20 LAB — BASIC METABOLIC PANEL
BUN: 16 mg/dL (ref 6–23)
CHLORIDE: 104 meq/L (ref 96–112)
CO2: 24 mEq/L (ref 19–32)
Calcium: 9.6 mg/dL (ref 8.4–10.5)
Creatinine, Ser: 1 mg/dL (ref 0.4–1.2)
GFR: 59.76 mL/min — AB (ref >60.00)
GLUCOSE: 81 mg/dL (ref 70–99)
POTASSIUM: 4.2 meq/L (ref 3.5–5.1)
SODIUM: 137 meq/L (ref 135–145)

## 2013-09-20 NOTE — Progress Notes (Signed)
Pre visit review using our clinic review tool, if applicable. No additional management support is needed unless otherwise documented below in the visit note. 

## 2013-09-20 NOTE — Patient Instructions (Signed)
Follow up as scheduled for your physical We'll notify you of your lab results and make any changes if needed Continue the Lisinopril daily- your BP looks great! Keep up the good work!  You look great! Call with any questions or concerns Have a great fall!

## 2013-09-20 NOTE — Progress Notes (Signed)
   Subjective:    Patient ID: Audrey Glover, female    DOB: 08/22/1955, 58 y.o.   MRN: [id number]  HPI HTN- chronic problem, on Lisinopril.  No CP, SOB, HAs, visual changes, edema, abd pain, N/V.  Does monitor home BP- noted that home BPs were climbing during the day and moved dose to AM rather than PM and BPs have since decreased.   Review of Systems For ROS see HPI     Objective:   Physical Exam  Vitals reviewed. Constitutional: She is oriented to person, place, and time. She appears well-developed and well-nourished. No distress.  HENT:  Head: Normocephalic and atraumatic.  Eyes: Conjunctivae and EOM are normal. Pupils are equal, round, and reactive to light.  Neck: Normal range of motion. Neck supple. No thyromegaly present.  Cardiovascular: Normal rate, regular rhythm, normal heart sounds and intact distal pulses.   No murmur heard. Pulmonary/Chest: Effort normal and breath sounds normal. No respiratory distress.  Abdominal: Soft. She exhibits no distension. There is no tenderness.  Musculoskeletal: She exhibits no edema.  Lymphadenopathy:    She has no cervical adenopathy.  Neurological: She is alert and oriented to person, place, and time.  Skin: Skin is warm and dry.  Psychiatric: She has a normal mood and affect. Her behavior is normal.          Assessment & Plan:

## 2013-09-20 NOTE — Assessment & Plan Note (Signed)
Chronic problem.  Well controlled.  Asymptomatic.  Check labs.  No anticipated med changes. 

## 2013-10-02 ENCOUNTER — Telehealth: Payer: Self-pay | Admitting: Family Medicine

## 2013-10-02 MED ORDER — LISINOPRIL 10 MG PO TABS: 10.0000 mg | ORAL_TABLET | Freq: Every day | ORAL | Status: DC

## 2013-10-02 NOTE — Telephone Encounter (Signed)
Med filled.  

## 2013-10-02 NOTE — Telephone Encounter (Signed)
Pt is needing new rx lisinopril (PRINIVIL,ZESTRIL) 10 MG tablet Send to [facility], [place].

## 2013-12-26 ENCOUNTER — Encounter: Payer: Self-pay | Admitting: Family Medicine

## 2013-12-26 ENCOUNTER — Ambulatory Visit (INDEPENDENT_AMBULATORY_CARE_PROVIDER_SITE_OTHER): Payer: Managed Care, Other (non HMO) | Admitting: Family Medicine

## 2013-12-26 VITALS — BP 120/78 | HR 97 | Temp 98.3°F | Resp 16 | Ht 65.0 in | Wt 144.4 lb

## 2013-12-26 DIAGNOSIS — Z0000 Encounter for general adult medical examination without abnormal findings: Secondary | ICD-10-CM

## 2013-12-26 LAB — TSH: TSH: 3.43 u[IU]/mL (ref 0.35–4.50)

## 2013-12-26 LAB — LIPID PANEL
Cholesterol: 193 mg/dL (ref 0–200)
HDL: 49.4 mg/dL (ref >39.00)
LDL CALC: 120 mg/dL — AB (ref 0–99)
NONHDL: 143.6
Total CHOL/HDL Ratio: 4
Triglycerides: 120 mg/dL (ref 0.0–149.0)
VLDL: 24 mg/dL (ref 0.0–40.0)

## 2013-12-26 LAB — CBC WITH DIFFERENTIAL/PLATELET
BASOS PCT: 0.8 % (ref 0.0–3.0)
Basophils Absolute: 0 10*3/uL (ref 0.0–0.1)
EOS PCT: 2.7 % (ref 0.0–5.0)
Eosinophils Absolute: 0.2 10*3/uL (ref 0.0–0.7)
HCT: 40.8 % (ref 36.0–46.0)
Hemoglobin: 13.7 g/dL (ref 12.0–15.0)
LYMPHS PCT: 36.6 % (ref 12.0–46.0)
Lymphs Abs: 2.1 10*3/uL (ref 0.7–4.0)
MCHC: 33.6 g/dL (ref 30.0–36.0)
MCV: 92 fl (ref 78.0–100.0)
Monocytes Absolute: 0.4 10*3/uL (ref 0.1–1.0)
Monocytes Relative: 6.9 % (ref 3.0–12.0)
NEUTROS PCT: 53 % (ref 43.0–77.0)
Neutro Abs: 3.1 10*3/uL (ref 1.4–7.7)
Platelets: 298 10*3/uL (ref 150.0–400.0)
RBC: 4.43 Mil/uL (ref 3.87–5.11)
RDW: 13.5 % (ref 11.5–15.5)
WBC: 5.8 10*3/uL (ref 4.0–10.5)

## 2013-12-26 LAB — BASIC METABOLIC PANEL
BUN: 13 mg/dL (ref 6–23)
CALCIUM: 9 mg/dL (ref 8.4–10.5)
CO2: 25 meq/L (ref 19–32)
CREATININE: 1 mg/dL (ref 0.4–1.2)
Chloride: 104 mEq/L (ref 96–112)
GFR: 59.03 mL/min — ABNORMAL LOW (ref >60.00)
GLUCOSE: 81 mg/dL (ref 70–99)
Potassium: 4.1 mEq/L (ref 3.5–5.1)
Sodium: 138 mEq/L (ref 135–145)

## 2013-12-26 LAB — HEPATIC FUNCTION PANEL
ALBUMIN: 4.2 g/dL (ref 3.5–5.2)
ALT: 22 U/L (ref 0–35)
AST: 21 U/L (ref 0–37)
Alkaline Phosphatase: 45 U/L (ref 39–117)
BILIRUBIN DIRECT: 0 mg/dL (ref 0.0–0.3)
TOTAL PROTEIN: 6.8 g/dL (ref 6.0–8.3)
Total Bilirubin: 0.3 mg/dL (ref 0.2–1.2)

## 2013-12-26 LAB — VITAMIN D 25 HYDROXY (VIT D DEFICIENCY, FRACTURES): VITD: 38.75 ng/mL (ref 30.00–100.00)

## 2013-12-26 NOTE — Progress Notes (Signed)
   Subjective:    Patient ID: Audrey Glover, female    DOB: August 16, 1955, 58 y.o.   MRN: [id number]  HPI CPE- UTD on colonoscopy, mammo and pap.   Review of Systems Patient reports no vision/ hearing changes, adenopathy,fever, weight change,  persistant/recurrent hoarseness , swallowing issues, chest pain, palpitations, edema, persistant/recurrent cough, hemoptysis, dyspnea (rest/exertional/paroxysmal nocturnal), gastrointestinal bleeding (melena, rectal bleeding), abdominal pain, significant heartburn, bowel changes, GU symptoms (dysuria, hematuria, incontinence), Gyn symptoms (abnormal  bleeding, pain),  syncope, focal weakness, memory loss, numbness & tingling, skin/hair/nail changes, abnormal bruising or bleeding, anxiety, or depression.     Objective:   Physical Exam General Appearance:    Alert, cooperative, no distress, appears stated age  Head:    Normocephalic, without obvious abnormality, atraumatic  Eyes:    PERRL, conjunctiva/corneas clear, EOM's intact, fundi    benign, both eyes  Ears:    Normal TM's and external ear canals, both ears  Nose:   Nares normal, septum midline, mucosa normal, no drainage    or sinus tenderness  Throat:   Lips, mucosa, and tongue normal; teeth and gums normal  Neck:   Supple, symmetrical, trachea midline, no adenopathy;    Thyroid: no enlargement/tenderness/nodules  Back:     Symmetric, no curvature, ROM normal, no CVA tenderness  Lungs:     Clear to auscultation bilaterally, respirations unlabored  Chest Wall:    No tenderness or deformity   Heart:    Regular rate and rhythm, S1 and S2 normal, no murmur, rub   or gallop  Breast Exam:    Deferred to GYN  Abdomen:     Soft, non-tender, bowel sounds active all four quadrants,    no masses, no organomegaly  Genitalia:    Deferred to GYN  Rectal:    Extremities:   Extremities normal, atraumatic, no cyanosis or edema  Pulses:   2+ and symmetric all extremities  Skin:   Skin color, texture,  turgor normal, no rashes or lesions  Lymph nodes:   Cervical, supraclavicular, and axillary nodes normal  Neurologic:   CNII-XII intact, normal strength, sensation and reflexes    throughout          Assessment & Plan:

## 2013-12-26 NOTE — Patient Instructions (Signed)
Follow up in 6 months to recheck BP We'll notify you of your lab results and make any changes if needed Keep up the good work!  You look great! Call your insurance company and ask about a bone density test and let me know Call with any questions or concerns Happy Holidays!!!

## 2013-12-26 NOTE — Progress Notes (Signed)
Pre visit review using our clinic review tool, if applicable. No additional management support is needed unless otherwise documented below in the visit note. 

## 2013-12-26 NOTE — Assessment & Plan Note (Signed)
Pt's PE WNL.  UTD on GYN and colonoscopy.  Check labs.  Anticipatory guidance provided.  

## 2014-03-08 ENCOUNTER — Other Ambulatory Visit: Payer: Self-pay

## 2014-03-08 DIAGNOSIS — Z1231 Encounter for screening mammogram for malignant neoplasm of breast: Secondary | ICD-10-CM

## 2014-03-12 ENCOUNTER — Ambulatory Visit
Admission: RE | Admit: 2014-03-12 | Discharge: 2014-03-12 | Disposition: A | Discharge status: Home / Self Care | Payer: Managed Care, Other (non HMO) | Source: Ambulatory Visit

## 2014-03-12 DIAGNOSIS — Z1231 Encounter for screening mammogram for malignant neoplasm of breast: Secondary | ICD-10-CM

## 2014-04-03 ENCOUNTER — Encounter: Payer: Self-pay | Admitting: Family Medicine

## 2014-04-03 MED ORDER — LISINOPRIL 10 MG PO TABS: 10.0000 mg | ORAL_TABLET | Freq: Every day | ORAL | Status: DC

## 2014-04-03 NOTE — Telephone Encounter (Signed)
Med filled.  

## 2014-06-18 ENCOUNTER — Ambulatory Visit (INDEPENDENT_AMBULATORY_CARE_PROVIDER_SITE_OTHER): Payer: Managed Care, Other (non HMO) | Admitting: Family Medicine

## 2014-06-18 ENCOUNTER — Encounter: Payer: Self-pay | Admitting: Family Medicine

## 2014-06-18 VITALS — BP 124/80 | HR 103 | Temp 98.7°F | Resp 16 | Wt 149.2 lb

## 2014-06-18 DIAGNOSIS — I10 Essential (primary) hypertension: Secondary | ICD-10-CM | POA: Diagnosis not present

## 2014-06-18 LAB — BASIC METABOLIC PANEL
BUN: 15 mg/dL (ref 6–23)
CALCIUM: 9.3 mg/dL (ref 8.4–10.5)
CHLORIDE: 102 meq/L (ref 96–112)
CO2: 28 mEq/L (ref 19–32)
CREATININE: 1.15 mg/dL (ref 0.40–1.20)
GFR: 51.31 mL/min — AB (ref >60.00)
GLUCOSE: 85 mg/dL (ref 70–99)
POTASSIUM: 4.1 meq/L (ref 3.5–5.1)
SODIUM: 136 meq/L (ref 135–145)

## 2014-06-18 NOTE — Progress Notes (Signed)
Pre visit review using our clinic review tool, if applicable. No additional management support is needed unless otherwise documented below in the visit note. 

## 2014-06-18 NOTE — Patient Instructions (Signed)
Schedule your complete physical in 6 months We'll notify you of your lab results and make any changes if needed Keep up the good work!  You look great! Call with any questions or concerns Safe travels!!!

## 2014-06-18 NOTE — Progress Notes (Signed)
   Subjective:    Patient ID: Audrey Glover, female    DOB: December 24, 1955, 59 y.o.   MRN: [id number]  HPI HTN- chronic problem, on Lisinopril daily.  Well controlled.  Denies CP, SOB, HAs, visual changes, edema.  Limited exercise due to hectic schedule.   Review of Systems For ROS see HPI     Objective:   Physical Exam  Constitutional: She is oriented to person, place, and time. She appears well-developed and well-nourished. No distress.  HENT:  Head: Normocephalic and atraumatic.  Eyes: Conjunctivae and EOM are normal. Pupils are equal, round, and reactive to light.  Neck: Normal range of motion. Neck supple. No thyromegaly present.  Cardiovascular: Normal rate, regular rhythm, normal heart sounds and intact distal pulses.   No murmur heard. Pulmonary/Chest: Effort normal and breath sounds normal. No respiratory distress.  Abdominal: Soft. She exhibits no distension. There is no tenderness.  Musculoskeletal: She exhibits no edema.  Lymphadenopathy:    She has no cervical adenopathy.  Neurological: She is alert and oriented to person, place, and time.  Skin: Skin is warm and dry.  Psychiatric: She has a normal mood and affect. Her behavior is normal.  Vitals reviewed.         Assessment & Plan:

## 2014-06-19 NOTE — Assessment & Plan Note (Signed)
Pt's BP well controlled.  Asymptomatic.  Check labs.  No anticipated med changes.

## 2014-07-09 ENCOUNTER — Ambulatory Visit: Payer: Managed Care, Other (non HMO) | Admitting: Family Medicine

## 2014-10-07 ENCOUNTER — Encounter: Payer: Self-pay | Admitting: Family Medicine

## 2014-10-08 ENCOUNTER — Other Ambulatory Visit: Payer: Self-pay | Admitting: General Practice

## 2014-10-08 MED ORDER — LISINOPRIL 10 MG PO TABS: 10.0000 mg | ORAL_TABLET | Freq: Every day | ORAL | Status: DC

## 2014-11-30 ENCOUNTER — Ambulatory Visit (INDEPENDENT_AMBULATORY_CARE_PROVIDER_SITE_OTHER): Payer: Managed Care, Other (non HMO) | Admitting: Family Medicine

## 2014-11-30 VITALS — BP 124/78 | HR 90 | Temp 98.4°F | Resp 18 | Ht 66.25 in | Wt 147.2 lb

## 2014-11-30 DIAGNOSIS — J069 Acute upper respiratory infection, unspecified: Secondary | ICD-10-CM

## 2014-11-30 DIAGNOSIS — J019 Acute sinusitis, unspecified: Secondary | ICD-10-CM | POA: Diagnosis not present

## 2014-11-30 DIAGNOSIS — R05 Cough: Secondary | ICD-10-CM | POA: Diagnosis not present

## 2014-11-30 DIAGNOSIS — R059 Cough, unspecified: Secondary | ICD-10-CM

## 2014-11-30 MED ORDER — BENZONATATE 100 MG PO CAPS: 100.0000 mg | ORAL_CAPSULE | Freq: Three times a day (TID) | ORAL | Status: DC | PRN

## 2014-11-30 MED ORDER — AMOXICILLIN-POT CLAVULANATE 875-125 MG PO TABS: 1.0000 | ORAL_TABLET | Freq: Two times a day (BID) | ORAL | Status: DC

## 2014-11-30 NOTE — Patient Instructions (Signed)
Saline nasal spray atleast 4 times per day for nasal congestion, over the counter mucinex or mucinex DM for cough.  I also sent in tessalon perles if you would rather use these for the cough. drink plenty of fluids. If not starting to improve in next 3-4 days - can start Augmentin (antibiotic).   Return to the clinic or go to the nearest emergency room if any of your symptoms worsen or new symptoms occur.  Upper Respiratory Infection, Adult Most upper respiratory infections (URIs) are a viral infection of the air passages leading to the lungs. A URI affects the nose, throat, and upper air passages. The most common type of URI is nasopharyngitis and is typically referred to as "the common cold." URIs run their course and usually go away on their own. Most of the time, a URI does not require medical attention, but sometimes a bacterial infection in the upper airways can follow a viral infection. This is called a secondary infection. Sinus and middle ear infections are common types of secondary upper respiratory infections. Bacterial pneumonia can also complicate a URI. A URI can worsen asthma and chronic obstructive pulmonary disease (COPD). Sometimes, these complications can require emergency medical care and may be life threatening.  CAUSES Almost all URIs are caused by viruses. A virus is a type of germ and can spread from one person to another.  RISKS FACTORS You may be at risk for a URI if:   You smoke.   You have chronic heart or lung disease.  You have a weakened defense (immune) system.   You are very young or very old.   You have nasal allergies or asthma.  You work in crowded or poorly ventilated areas.  You work in health care facilities or schools. SIGNS AND SYMPTOMS  Symptoms typically develop 2-3 days after you come in contact with a cold virus. Most viral URIs last 7-10 days. However, viral URIs from the influenza virus (flu virus) can last 14-18 days and are typically more  severe. Symptoms may include:   Runny or stuffy (congested) nose.   Sneezing.   Cough.   Sore throat.   Headache.   Fatigue.   Fever.   Loss of appetite.   Pain in your forehead, behind your eyes, and over your cheekbones (sinus pain).  Muscle aches.  DIAGNOSIS  Your health care provider may diagnose a URI by:  Physical exam.  Tests to check that your symptoms are not due to another condition such as:  Strep throat.  Sinusitis.  Pneumonia.  Asthma. TREATMENT  A URI goes away on its own with time. It cannot be cured with medicines, but medicines may be prescribed or recommended to relieve symptoms. Medicines may help:  Reduce your fever.  Reduce your cough.  Relieve nasal congestion. HOME CARE INSTRUCTIONS   Take medicines only as directed by your health care provider.   Gargle warm saltwater or take cough drops to comfort your throat as directed by your health care provider.  Use a warm mist humidifier or inhale steam from a shower to increase air moisture. This may make it easier to breathe.  Drink enough fluid to keep your urine clear or pale yellow.   Eat soups and other clear broths and maintain good nutrition.   Rest as needed.   Return to work when your temperature has returned to normal or as your health care provider advises. You may need to stay home longer to avoid infecting others. You can also  use a face mask and careful hand washing to prevent spread of the virus.  Increase the usage of your inhaler if you have asthma.   Do not use any tobacco products, including cigarettes, chewing tobacco, or electronic cigarettes. If you need help quitting, ask your health care provider. PREVENTION  The best way to protect yourself from getting a cold is to practice good hygiene.   Avoid oral or hand contact with people with cold symptoms.   Wash your hands often if contact occurs.  There is no clear evidence that vitamin C, vitamin  E, echinacea, or exercise reduces the chance of developing a cold. However, it is always recommended to get plenty of rest, exercise, and practice good nutrition.  SEEK MEDICAL CARE IF:   You are getting worse rather than better.   Your symptoms are not controlled by medicine.   You have chills.  You have worsening shortness of breath.  You have brown or red mucus.  You have yellow or brown nasal discharge.  You have pain in your face, especially when you bend forward.  You have a fever.  You have swollen neck glands.  You have pain while swallowing.  You have white areas in the back of your throat. SEEK IMMEDIATE MEDICAL CARE IF:   You have severe or persistent:  Headache.  Ear pain.  Sinus pain.  Chest pain.  You have chronic lung disease and any of the following:  Wheezing.  Prolonged cough.  Coughing up blood.  A change in your usual mucus.  You have a stiff neck.  You have changes in your:  Vision.  Hearing.  Thinking.  Mood. MAKE SURE YOU:   Understand these instructions.  Will watch your condition.  Will get help right away if you are not doing well or get worse.   This information is not intended to replace advice given to you by your health care provider. Make sure you discuss any questions you have with your health care provider.   Document Released: 06/23/2000 Document Revised: 05/14/2014 Document Reviewed: 04/04/2013 Elsevier Interactive Patient Education 2016 [employer]  Sinusitis, Adult Sinusitis is redness, soreness, and inflammation of the paranasal sinuses. Paranasal sinuses are air pockets within the bones of your face. They are located beneath your eyes, in the middle of your forehead, and above your eyes. In healthy paranasal sinuses, mucus is able to drain out, and air is able to circulate through them by way of your nose. However, when your paranasal sinuses are inflamed, mucus and air can become trapped. This can  allow bacteria and other germs to grow and cause infection. Sinusitis can develop quickly and last only a short time (acute) or continue over a long period (chronic). Sinusitis that lasts for more than 12 weeks is considered chronic. CAUSES Causes of sinusitis include:  Allergies.  Structural abnormalities, such as displacement of the cartilage that separates your nostrils (deviated septum), which can decrease the air flow through your nose and sinuses and affect sinus drainage.  Functional abnormalities, such as when the small hairs (cilia) that line your sinuses and help remove mucus do not work properly or are not present. SIGNS AND SYMPTOMS Symptoms of acute and chronic sinusitis are the same. The primary symptoms are pain and pressure around the affected sinuses. Other symptoms include:  Upper toothache.  Earache.  Headache.  Bad breath.  Decreased sense of smell and taste.  A cough, which worsens when you are lying flat.  Fatigue.  Fever.  Thick drainage from your nose, which often is green and may contain pus (purulent).  Swelling and warmth over the affected sinuses. DIAGNOSIS Your health care provider will perform a physical exam. During your exam, your health care provider may perform any of the following to help determine if you have acute sinusitis or chronic sinusitis:  Look in your nose for signs of abnormal growths in your nostrils (nasal polyps).  Tap over the affected sinus to check for signs of infection.  View the inside of your sinuses using an imaging device that has a light attached (endoscope). If your health care provider suspects that you have chronic sinusitis, one or more of the following tests may be recommended:  Allergy tests.  Nasal culture. A sample of mucus is taken from your nose, sent to a lab, and screened for bacteria.  Nasal cytology. A sample of mucus is taken from your nose and examined by your health care provider to determine if  your sinusitis is related to an allergy. TREATMENT Most cases of acute sinusitis are related to a viral infection and will resolve on their own within 10 days. Sometimes, medicines are prescribed to help relieve symptoms of both acute and chronic sinusitis. These may include pain medicines, decongestants, nasal steroid sprays, or saline sprays. However, for sinusitis related to a bacterial infection, your health care provider will prescribe antibiotic medicines. These are medicines that will help kill the bacteria causing the infection. Rarely, sinusitis is caused by a fungal infection. In these cases, your health care provider will prescribe antifungal medicine. For some cases of chronic sinusitis, surgery is needed. Generally, these are cases in which sinusitis recurs more than 3 times per year, despite other treatments. HOME CARE INSTRUCTIONS  Drink plenty of water. Water helps thin the mucus so your sinuses can drain more easily.  Use a humidifier.  Inhale steam 3-4 times a day (for example, sit in the bathroom with the shower running).  Apply a warm, moist washcloth to your face 3-4 times a day, or as directed by your health care provider.  Use saline nasal sprays to help moisten and clean your sinuses.  Take medicines only as directed by your health care provider.  If you were prescribed either an antibiotic or antifungal medicine, finish it all even if you start to feel better. SEEK IMMEDIATE MEDICAL CARE IF:  You have increasing pain or severe headaches.  You have nausea, vomiting, or drowsiness.  You have swelling around your face.  You have vision problems.  You have a stiff neck.  You have difficulty breathing.   This information is not intended to replace advice given to you by your health care provider. Make sure you discuss any questions you have with your health care provider.   Document Released: 12/28/2004 Document Revised: 01/18/2014 Document Reviewed:  01/12/2011 Elsevier Interactive Patient Education [employer].

## 2014-11-30 NOTE — Progress Notes (Signed)
Subjective:  This chart was scribed for Merri Ray, MD by [facility], medical scribe at [facility].The patient was seen in exam room 10 and the patient's care was started at 9:47 AM.   Patient ID: Audrey Glover, female    DOB: 11/05/1955, 59 y.o.   MRN: [id number] Chief Complaint  Patient presents with  . Sinusitis    x1 week. Fatigue, runny nose, dry cough.    HPI HPI Comments: Audrey Glover is a 59 y.o. female who presents to [facility] complaining of sinus congestion for the past week. Associated headache, clear nasal discharge, dry cough, post nasal drip, and ear pressure. She did have a low grade fever of 99.4 yesterday and today with chills. She has not tried anything to relieve these symptoms. No known sick contacts. She does have a hx of sinus congestion and allergies. Works in a bank. She does take a daily probiotic.   Patient Active Problem List   Diagnosis Date Noted  . Routine general medical examination at a health care facility 10/04/2012  . HTN (hypertension) 10/04/2012   Past Medical History  Diagnosis Date  . Hypertension   . Allergy   . Anemia    Past Surgical History  Procedure Laterality Date  . Uterine fibroid removed     Allergies  Allergen Reactions  . Sulfa Antibiotics Rash   Prior to Admission medications   Medication Sig Start Date End Date Taking? Authorizing Provider  B Complex-C (B-COMPLEX WITH VITAMIN C) tablet Take 1 tablet by mouth every morning.   Yes Historical Provider, MD  fish oil-omega-3 fatty acids 1000 MG capsule Take 1 g by mouth every morning.   Yes Historical Provider, MD  lisinopril (PRINIVIL,ZESTRIL) 10 MG tablet Take 1 tablet (10 mg total) by mouth daily. 10/08/14  Yes Midge Minium, MD  Multiple Vitamins-Minerals (MULTIVITAMIN ADULT PO) Take by mouth.   Yes Historical Provider, MD  ranitidine (ZANTAC) 75 MG tablet Take 75 mg by mouth daily as needed.    Yes  Historical Provider, MD  acidophilus (RISAQUAD) CAPS capsule 1 capsule by mouth 3-4 times a week.    Historical Provider, MD  loratadine (CLARITIN) 10 MG tablet Take 10 mg by mouth daily as needed for allergies.    Historical Provider, MD   Social History   Social History  . Marital Status: Single    Spouse Name: N/A  . Number of Children: N/A  . Years of Education: N/A   Occupational History  . Not on file.   Social History Main Topics  . Smoking status: Former Research scientist (life sciences)  . Smokeless tobacco: Not on file  . Alcohol Use: 1.2 oz/week    2 Glasses of wine per week  . Drug Use: No  . Sexual Activity: No   Other Topics Concern  . Not on file   Social History Narrative   Review of Systems  Constitutional: Positive for fever and chills.  HENT: Positive for congestion, postnasal drip, rhinorrhea and sinus pressure.   Respiratory: Positive for cough.   Allergic/Immunologic: Positive for environmental allergies.  Neurological: Positive for headaches.      Objective:  BP 124/78 mmHg  Pulse 90  Temp(Src) 98.4 F (36.9 C) (Oral)  Resp 18  Ht 5' 6.25" (1.683 m)  Wt 147 lb 3.2 oz (66.769 kg)  BMI 23.57 kg/m2  SpO2 98% Physical Exam  Constitutional: She is oriented to person, place, and time. She appears well-developed  and well-nourished. No distress.  HENT:  Head: Normocephalic and atraumatic.  Right Ear: Hearing, tympanic membrane, external ear and ear canal normal.  Left Ear: Hearing, tympanic membrane, external ear and ear canal normal.  Nose: Nose normal.  Mouth/Throat: Oropharynx is clear and moist. No oropharyngeal exudate.  TMs purely grey. Turbinates are edematous. Minimal discomfort frontal and maxillary sinuses bilaterally.  Eyes: Conjunctivae and EOM are normal. Pupils are equal, round, and reactive to light.  Neck: Normal range of motion.  Cardiovascular: Normal rate, regular rhythm, normal heart sounds and intact distal pulses.   No murmur  heard. Pulmonary/Chest: Effort normal and breath sounds normal. No respiratory distress. She has no wheezes. She has no rhonchi.  Musculoskeletal: Normal range of motion.  Lymphadenopathy:    She has no cervical adenopathy.  Neurological: She is alert and oriented to person, place, and time.  Skin: Skin is warm and dry. No rash noted.  Psychiatric: She has a normal mood and affect. Her behavior is normal.  Nursing note and vitals reviewed.     Assessment & Plan:   Audrey Glover is a 59 y.o. female Acute upper respiratory infection  Cough  Acute sinusitis, recurrence not specified, unspecified location - Plan: amoxicillin-clavulanate (AUGMENTIN) 875-125 MG tablet  Previous nasal congestion may be allergic rhinitis - discussed flonase or other steroid nasal spray otc and correct technique.  Current symptoms suspected viral URI, with PND/cough and increased sinus pressure vs. early sinusitis.   -saline ns, mucinex otc, tessalon perles if needed for cough.   -rx printed for Augmentin to fill in few days if not starting to improve.   -rtc precautions if any worsening.   Meds ordered this encounter  Medications  . amoxicillin-clavulanate (AUGMENTIN) 875-125 MG tablet    Sig: Take 1 tablet by mouth 2 (two) times daily.    Dispense:  20 tablet    Refill:  0  . benzonatate (TESSALON) 100 MG capsule    Sig: Take 1 capsule (100 mg total) by mouth 3 (three) times daily as needed for cough.    Dispense:  20 capsule    Refill:  0   Patient Instructions  Saline nasal spray atleast 4 times per day for nasal congestion, over the counter mucinex or mucinex DM for cough.  I also sent in tessalon perles if you would rather use these for the cough. drink plenty of fluids. If not starting to improve in next 3-4 days - can start Augmentin (antibiotic).   Return to the clinic or go to the nearest emergency room if any of your symptoms worsen or new symptoms occur.  Upper Respiratory  Infection, Adult Most upper respiratory infections (URIs) are a viral infection of the air passages leading to the lungs. A URI affects the nose, throat, and upper air passages. The most common type of URI is nasopharyngitis and is typically referred to as "the common cold." URIs run their course and usually go away on their own. Most of the time, a URI does not require medical attention, but sometimes a bacterial infection in the upper airways can follow a viral infection. This is called a secondary infection. Sinus and middle ear infections are common types of secondary upper respiratory infections. Bacterial pneumonia can also complicate a URI. A URI can worsen asthma and chronic obstructive pulmonary disease (COPD). Sometimes, these complications can require emergency medical care and may be life threatening.  CAUSES Almost all URIs are caused by viruses. A virus is a type of  germ and can spread from one person to another.  RISKS FACTORS You may be at risk for a URI if:   You smoke.   You have chronic heart or lung disease.  You have a weakened defense (immune) system.   You are very young or very old.   You have nasal allergies or asthma.  You work in crowded or poorly ventilated areas.  You work in health care facilities or schools. SIGNS AND SYMPTOMS  Symptoms typically develop 2-3 days after you come in contact with a cold virus. Most viral URIs last 7-10 days. However, viral URIs from the influenza virus (flu virus) can last 14-18 days and are typically more severe. Symptoms may include:   Runny or stuffy (congested) nose.   Sneezing.   Cough.   Sore throat.   Headache.   Fatigue.   Fever.   Loss of appetite.   Pain in your forehead, behind your eyes, and over your cheekbones (sinus pain).  Muscle aches.  DIAGNOSIS  Your health care provider may diagnose a URI by:  Physical exam.  Tests to check that your symptoms are not due to another condition  such as:  Strep throat.  Sinusitis.  Pneumonia.  Asthma. TREATMENT  A URI goes away on its own with time. It cannot be cured with medicines, but medicines may be prescribed or recommended to relieve symptoms. Medicines may help:  Reduce your fever.  Reduce your cough.  Relieve nasal congestion. HOME CARE INSTRUCTIONS   Take medicines only as directed by your health care provider.   Gargle warm saltwater or take cough drops to comfort your throat as directed by your health care provider.  Use a warm mist humidifier or inhale steam from a shower to increase air moisture. This may make it easier to breathe.  Drink enough fluid to keep your urine clear or pale yellow.   Eat soups and other clear broths and maintain good nutrition.   Rest as needed.   Return to work when your temperature has returned to normal or as your health care provider advises. You may need to stay home longer to avoid infecting others. You can also use a face mask and careful hand washing to prevent spread of the virus.  Increase the usage of your inhaler if you have asthma.   Do not use any tobacco products, including cigarettes, chewing tobacco, or electronic cigarettes. If you need help quitting, ask your health care provider. PREVENTION  The best way to protect yourself from getting a cold is to practice good hygiene.   Avoid oral or hand contact with people with cold symptoms.   Wash your hands often if contact occurs.  There is no clear evidence that vitamin C, vitamin E, echinacea, or exercise reduces the chance of developing a cold. However, it is always recommended to get plenty of rest, exercise, and practice good nutrition.  SEEK MEDICAL CARE IF:   You are getting worse rather than better.   Your symptoms are not controlled by medicine.   You have chills.  You have worsening shortness of breath.  You have brown or red mucus.  You have yellow or brown nasal discharge.  You  have pain in your face, especially when you bend forward.  You have a fever.  You have swollen neck glands.  You have pain while swallowing.  You have white areas in the back of your throat. SEEK IMMEDIATE MEDICAL CARE IF:   You have severe or persistent:  Headache.  Ear pain.  Sinus pain.  Chest pain.  You have chronic lung disease and any of the following:  Wheezing.  Prolonged cough.  Coughing up blood.  A change in your usual mucus.  You have a stiff neck.  You have changes in your:  Vision.  Hearing.  Thinking.  Mood. MAKE SURE YOU:   Understand these instructions.  Will watch your condition.  Will get help right away if you are not doing well or get worse.   This information is not intended to replace advice given to you by your health care provider. Make sure you discuss any questions you have with your health care provider.   Document Released: 06/23/2000 Document Revised: 05/14/2014 Document Reviewed: 04/04/2013 Elsevier Interactive Patient Education 2016 Elsevier Inc.  Sinusitis, Adult Sinusitis is redness, soreness, and inflammation of the paranasal sinuses. Paranasal sinuses are air pockets within the bones of your face. They are located beneath your eyes, in the middle of your forehead, and above your eyes. In healthy paranasal sinuses, mucus is able to drain out, and air is able to circulate through them by way of your nose. However, when your paranasal sinuses are inflamed, mucus and air can become trapped. This can allow bacteria and other germs to grow and cause infection. Sinusitis can develop quickly and last only a short time (acute) or continue over a long period (chronic). Sinusitis that lasts for more than 12 weeks is considered chronic. CAUSES Causes of sinusitis include:  Allergies.  Structural abnormalities, such as displacement of the cartilage that separates your nostrils (deviated septum), which can decrease the air flow  through your nose and sinuses and affect sinus drainage.  Functional abnormalities, such as when the small hairs (cilia) that line your sinuses and help remove mucus do not work properly or are not present. SIGNS AND SYMPTOMS Symptoms of acute and chronic sinusitis are the same. The primary symptoms are pain and pressure around the affected sinuses. Other symptoms include:  Upper toothache.  Earache.  Headache.  Bad breath.  Decreased sense of smell and taste.  A cough, which worsens when you are lying flat.  Fatigue.  Fever.  Thick drainage from your nose, which often is green and may contain pus (purulent).  Swelling and warmth over the affected sinuses. DIAGNOSIS Your health care provider will perform a physical exam. During your exam, your health care provider may perform any of the following to help determine if you have acute sinusitis or chronic sinusitis:  Look in your nose for signs of abnormal growths in your nostrils (nasal polyps).  Tap over the affected sinus to check for signs of infection.  View the inside of your sinuses using an imaging device that has a light attached (endoscope). If your health care provider suspects that you have chronic sinusitis, one or more of the following tests may be recommended:  Allergy tests.  Nasal culture. A sample of mucus is taken from your nose, sent to a lab, and screened for bacteria.  Nasal cytology. A sample of mucus is taken from your nose and examined by your health care provider to determine if your sinusitis is related to an allergy. TREATMENT Most cases of acute sinusitis are related to a viral infection and will resolve on their own within 10 days. Sometimes, medicines are prescribed to help relieve symptoms of both acute and chronic sinusitis. These may include pain medicines, decongestants, nasal steroid sprays, or saline sprays. However, for sinusitis related to a bacterial  infection, your health care provider  will prescribe antibiotic medicines. These are medicines that will help kill the bacteria causing the infection. Rarely, sinusitis is caused by a fungal infection. In these cases, your health care provider will prescribe antifungal medicine. For some cases of chronic sinusitis, surgery is needed. Generally, these are cases in which sinusitis recurs more than 3 times per year, despite other treatments. HOME CARE INSTRUCTIONS  Drink plenty of water. Water helps thin the mucus so your sinuses can drain more easily.  Use a humidifier.  Inhale steam 3-4 times a day (for example, sit in the bathroom with the shower running).  Apply a warm, moist washcloth to your face 3-4 times a day, or as directed by your health care provider.  Use saline nasal sprays to help moisten and clean your sinuses.  Take medicines only as directed by your health care provider.  If you were prescribed either an antibiotic or antifungal medicine, finish it all even if you start to feel better. SEEK IMMEDIATE MEDICAL CARE IF:  You have increasing pain or severe headaches.  You have nausea, vomiting, or drowsiness.  You have swelling around your face.  You have vision problems.  You have a stiff neck.  You have difficulty breathing.   This information is not intended to replace advice given to you by your health care provider. Make sure you discuss any questions you have with your health care provider.   Document Released: 12/28/2004 Document Revised: 01/18/2014 Document Reviewed: 01/12/2011 Elsevier Interactive Patient Education [employer].        By signing my name below, I, Nadim Abuhashem, attest that this documentation has been prepared under the direction and in the presence of Merri Ray, MD.  Electronically Signed: Lora Havens, medical scribe. 11/30/2014, 9:48 AM.

## 2014-12-27 ENCOUNTER — Encounter: Payer: Self-pay | Admitting: Behavioral Health

## 2014-12-27 ENCOUNTER — Telehealth: Payer: Self-pay | Admitting: Behavioral Health

## 2014-12-27 NOTE — Telephone Encounter (Signed)
Unable to reach patient at time of Pre-Visit Call.  Left message for patient to return call when available.    

## 2014-12-27 NOTE — Telephone Encounter (Signed)
Pre-Visit Call completed with patient and chart updated.   Pre-Visit Info documented in Specialty Comments under [employer].    

## 2014-12-27 NOTE — Addendum Note (Signed)
Addended by: Eduard Roux E on: 12/27/2014 12:15 PM   Modules accepted: Orders, Medications

## 2014-12-27 NOTE — Addendum Note (Signed)
Addended by: Eduard Roux E on: 12/27/2014 01:40 PM   Modules accepted: Medications

## 2014-12-30 ENCOUNTER — Encounter: Payer: Self-pay | Admitting: Family Medicine

## 2014-12-30 ENCOUNTER — Ambulatory Visit (INDEPENDENT_AMBULATORY_CARE_PROVIDER_SITE_OTHER): Payer: Managed Care, Other (non HMO) | Admitting: Family Medicine

## 2014-12-30 VITALS — BP 118/80 | HR 82 | Temp 98.3°F | Resp 17 | Ht 66.0 in | Wt 150.4 lb

## 2014-12-30 DIAGNOSIS — Z0000 Encounter for general adult medical examination without abnormal findings: Secondary | ICD-10-CM

## 2014-12-30 DIAGNOSIS — Z23 Encounter for immunization: Secondary | ICD-10-CM | POA: Diagnosis not present

## 2014-12-30 LAB — BASIC METABOLIC PANEL
BUN: 12 mg/dL (ref 6–23)
CO2: 28 mEq/L (ref 19–32)
CREATININE: 0.98 mg/dL (ref 0.40–1.20)
Calcium: 9.2 mg/dL (ref 8.4–10.5)
Chloride: 101 mEq/L (ref 96–112)
GFR: 61.6 mL/min (ref >60.00)
Glucose, Bld: 90 mg/dL (ref 70–99)
Potassium: 4.1 mEq/L (ref 3.5–5.1)
Sodium: 136 mEq/L (ref 135–145)

## 2014-12-30 LAB — CBC WITH DIFFERENTIAL/PLATELET
Basophils Absolute: 0 10*3/uL (ref 0.0–0.1)
Basophils Relative: 0.7 % (ref 0.0–3.0)
EOS ABS: 0.1 10*3/uL (ref 0.0–0.7)
Eosinophils Relative: 2.3 % (ref 0.0–5.0)
HCT: 41.5 % (ref 36.0–46.0)
HEMOGLOBIN: 13.9 g/dL (ref 12.0–15.0)
LYMPHS ABS: 1.6 10*3/uL (ref 0.7–4.0)
Lymphocytes Relative: 33.4 % (ref 12.0–46.0)
MCHC: 33.4 g/dL (ref 30.0–36.0)
MCV: 93.1 fl (ref 78.0–100.0)
MONO ABS: 0.5 10*3/uL (ref 0.1–1.0)
Monocytes Relative: 9.4 % (ref 3.0–12.0)
NEUTROS PCT: 54.2 % (ref 43.0–77.0)
Neutro Abs: 2.6 10*3/uL (ref 1.4–7.7)
Platelets: 227 10*3/uL (ref 150.0–400.0)
RBC: 4.46 Mil/uL (ref 3.87–5.11)
RDW: 13.5 % (ref 11.5–15.5)
WBC: 4.9 10*3/uL (ref 4.0–10.5)

## 2014-12-30 LAB — LIPID PANEL
CHOL/HDL RATIO: 4
Cholesterol: 234 mg/dL — ABNORMAL HIGH (ref 0–200)
HDL: 58 mg/dL (ref >39.00)
LDL CALC: 156 mg/dL — AB (ref 0–99)
NONHDL: 175.9
TRIGLYCERIDES: 102 mg/dL (ref 0.0–149.0)
VLDL: 20.4 mg/dL (ref 0.0–40.0)

## 2014-12-30 LAB — HEPATIC FUNCTION PANEL
ALBUMIN: 4.3 g/dL (ref 3.5–5.2)
ALT: 20 U/L (ref 0–35)
AST: 21 U/L (ref 0–37)
Alkaline Phosphatase: 50 U/L (ref 39–117)
BILIRUBIN TOTAL: 0.6 mg/dL (ref 0.2–1.2)
Bilirubin, Direct: 0.1 mg/dL (ref 0.0–0.3)
Total Protein: 6.9 g/dL (ref 6.0–8.3)

## 2014-12-30 LAB — TSH: TSH: 3.43 u[IU]/mL (ref 0.35–4.50)

## 2014-12-30 LAB — VITAMIN D 25 HYDROXY (VIT D DEFICIENCY, FRACTURES): VITD: 29.33 ng/mL — AB (ref 30.00–100.00)

## 2014-12-30 MED ORDER — ALBUTEROL SULFATE HFA 108 (90 BASE) MCG/ACT IN AERS: 2.0000 | INHALATION_SPRAY | Freq: Four times a day (QID) | RESPIRATORY_TRACT | Status: DC | PRN

## 2014-12-30 NOTE — Progress Notes (Signed)
   Subjective:    Patient ID: Audrey Glover, female    DOB: 1955-02-20, 59 y.o.   MRN: [id number]  HPI CPE- UTD on pap (due next year Dr Dellis Filbert), UTD on mammo, UTD on colonoscopy (2019, Dr Collene Mares)   Review of Systems Patient reports no vision/ hearing changes, adenopathy,fever, weight change,  persistant/recurrent hoarseness , swallowing issues, chest pain, palpitations, edema, persistant/recurrent cough, hemoptysis, dyspnea (rest/exertional/paroxysmal nocturnal), gastrointestinal bleeding (melena, rectal bleeding), abdominal pain, significant heartburn, bowel changes, GU symptoms (dysuria, hematuria, incontinence), Gyn symptoms (abnormal  bleeding, pain),  syncope, focal weakness, memory loss, numbness & tingling, skin/hair/nail changes, abnormal bruising or bleeding, anxiety, or depression.     Objective:   Physical Exam General Appearance:    Alert, cooperative, no distress, appears stated age  Head:    Normocephalic, without obvious abnormality, atraumatic  Eyes:    PERRL, conjunctiva/corneas clear, EOM's intact, fundi    benign, both eyes  Ears:    Normal TM's and external ear canals, both ears  Nose:   Nares normal, septum midline, mucosa normal, no drainage    or sinus tenderness  Throat:   Lips, mucosa, and tongue normal; teeth and gums normal  Neck:   Supple, symmetrical, trachea midline, no adenopathy;    Thyroid: no enlargement/tenderness/nodules  Back:     Symmetric, no curvature, ROM normal, no CVA tenderness  Lungs:     Clear to auscultation bilaterally, respirations unlabored  Chest Wall:    No tenderness or deformity   Heart:    Regular rate and rhythm, S1 and S2 normal, no murmur, rub   or gallop  Breast Exam:    Deferred to GYN  Abdomen:     Soft, non-tender, bowel sounds active all four quadrants,    no masses, no organomegaly  Genitalia:    Deferred to GYN  Rectal:    Extremities:   Extremities normal, atraumatic, no cyanosis or edema  Pulses:   2+ and  symmetric all extremities  Skin:   Skin color, texture, turgor normal, no rashes or lesions  Lymph nodes:   Cervical, supraclavicular, and axillary nodes normal  Neurologic:   CNII-XII intact, normal strength, sensation and reflexes    throughout          Assessment & Plan:

## 2014-12-30 NOTE — Progress Notes (Signed)
Pre visit review using our clinic review tool, if applicable. No additional management support is needed unless otherwise documented below in the visit note. 

## 2014-12-30 NOTE — Assessment & Plan Note (Signed)
Pt's PE WNL.  UTD on GYN, colonoscopy.  Flu shot today.  Check labs.  Anticipatory guidance provided.

## 2014-12-30 NOTE — Patient Instructions (Signed)
Follow up in 6 months to recheck BP We'll notify you of your lab results and make any changes if needed Keep up the good work on healthy diet and regular exercise- you look great! Start the albuterol inhaler as needed for coughing fits or shortness of breath- 2 puffs every 4 hrs only as needed Restart Claritin or Zyrtec daily Consider adding Nasacort to decrease the post nasal drip Drink plenty of fluids Call with any questions or concerns If you want to join [place] at the new [place] office, any scheduled appointments will automatically transfer and we will see you at [address] [place] [address] Audrey Gl[place], [place] [address] (OPENING 01/14/15) Happy Holidays!!!

## 2014-12-31 ENCOUNTER — Other Ambulatory Visit: Payer: Self-pay | Admitting: General Practice

## 2014-12-31 DIAGNOSIS — E785 Hyperlipidemia, unspecified: Secondary | ICD-10-CM

## 2014-12-31 MED ORDER — SIMVASTATIN 20 MG PO TABS: 20.0000 mg | ORAL_TABLET | Freq: Every day | ORAL | Status: DC

## 2015-01-15 ENCOUNTER — Telehealth: Payer: Self-pay | Admitting: Family Medicine

## 2015-01-15 MED ORDER — LISINOPRIL 10 MG PO TABS: 10.0000 mg | ORAL_TABLET | Freq: Every day | ORAL | Status: DC

## 2015-01-15 NOTE — Telephone Encounter (Signed)
Relation to pt: self Call back number:[phone] Pharmacy: [facility] [place], [place]  Reason for call:  Patient requesting a refill lisinopril (PRINIVIL,ZESTRIL) 10 MG tablet

## 2015-01-15 NOTE — Telephone Encounter (Signed)
Last filled:  10/08/14 Amt:  90,0 Last OV:  12/30/14 Med filled.

## 2015-03-10 ENCOUNTER — Other Ambulatory Visit (INDEPENDENT_AMBULATORY_CARE_PROVIDER_SITE_OTHER): Payer: Managed Care, Other (non HMO)

## 2015-03-10 DIAGNOSIS — E785 Hyperlipidemia, unspecified: Secondary | ICD-10-CM

## 2015-03-10 LAB — HEPATIC FUNCTION PANEL
ALT: 20 U/L (ref 0–35)
AST: 18 U/L (ref 0–37)
Albumin: 4.2 g/dL (ref 3.5–5.2)
Alkaline Phosphatase: 42 U/L (ref 39–117)
BILIRUBIN DIRECT: 0.1 mg/dL (ref 0.0–0.3)
BILIRUBIN TOTAL: 0.4 mg/dL (ref 0.2–1.2)
Total Protein: 6.7 g/dL (ref 6.0–8.3)

## 2015-03-25 ENCOUNTER — Other Ambulatory Visit: Payer: Self-pay | Admitting: General Practice

## 2015-03-25 MED ORDER — SIMVASTATIN 20 MG PO TABS: 20.0000 mg | ORAL_TABLET | Freq: Every day | ORAL | Status: DC

## 2015-03-26 ENCOUNTER — Other Ambulatory Visit: Payer: Self-pay

## 2015-03-26 DIAGNOSIS — Z1231 Encounter for screening mammogram for malignant neoplasm of breast: Secondary | ICD-10-CM

## 2015-04-01 ENCOUNTER — Ambulatory Visit
Admission: RE | Admit: 2015-04-01 | Discharge: 2015-04-01 | Disposition: A | Discharge status: Home / Self Care | Payer: Managed Care, Other (non HMO) | Source: Ambulatory Visit

## 2015-04-01 DIAGNOSIS — Z1231 Encounter for screening mammogram for malignant neoplasm of breast: Secondary | ICD-10-CM

## 2015-04-17 ENCOUNTER — Other Ambulatory Visit: Payer: Self-pay | Admitting: Family Medicine

## 2015-04-17 MED ORDER — LISINOPRIL 10 MG PO TABS: 10.0000 mg | ORAL_TABLET | Freq: Every day | ORAL | Status: DC

## 2015-04-17 NOTE — Telephone Encounter (Signed)
Medication filled to pharmacy as requested.   

## 2015-07-22 ENCOUNTER — Encounter: Payer: Self-pay | Admitting: Family Medicine

## 2015-07-22 ENCOUNTER — Ambulatory Visit (INDEPENDENT_AMBULATORY_CARE_PROVIDER_SITE_OTHER): Payer: Managed Care, Other (non HMO) | Admitting: Family Medicine

## 2015-07-22 VITALS — BP 119/81 | HR 68 | Temp 98.0°F | Resp 16 | Ht 66.0 in | Wt 151.1 lb

## 2015-07-22 DIAGNOSIS — I10 Essential (primary) hypertension: Secondary | ICD-10-CM

## 2015-07-22 DIAGNOSIS — Z23 Encounter for immunization: Secondary | ICD-10-CM

## 2015-07-22 DIAGNOSIS — E785 Hyperlipidemia, unspecified: Secondary | ICD-10-CM | POA: Insufficient documentation

## 2015-07-22 LAB — BASIC METABOLIC PANEL
BUN: 13 mg/dL (ref 6–23)
CALCIUM: 9.8 mg/dL (ref 8.4–10.5)
CO2: 29 meq/L (ref 19–32)
Chloride: 100 mEq/L (ref 96–112)
Creatinine, Ser: 1.11 mg/dL (ref 0.40–1.20)
GFR: 53.25 mL/min — ABNORMAL LOW (ref >60.00)
GLUCOSE: 86 mg/dL (ref 70–99)
Potassium: 4.7 mEq/L (ref 3.5–5.1)
SODIUM: 135 meq/L (ref 135–145)

## 2015-07-22 LAB — HEPATIC FUNCTION PANEL
ALBUMIN: 4.6 g/dL (ref 3.5–5.2)
ALK PHOS: 48 U/L (ref 39–117)
ALT: 19 U/L (ref 0–35)
AST: 19 U/L (ref 0–37)
Bilirubin, Direct: 0.1 mg/dL (ref 0.0–0.3)
TOTAL PROTEIN: 7 g/dL (ref 6.0–8.3)
Total Bilirubin: 0.5 mg/dL (ref 0.2–1.2)

## 2015-07-22 LAB — LIPID PANEL
CHOLESTEROL: 170 mg/dL (ref 0–200)
HDL: 61.2 mg/dL (ref >39.00)
LDL Cholesterol: 88 mg/dL (ref 0–99)
NonHDL: 108.32
TRIGLYCERIDES: 101 mg/dL (ref 0.0–149.0)
Total CHOL/HDL Ratio: 3
VLDL: 20.2 mg/dL (ref 0.0–40.0)

## 2015-07-22 LAB — CBC WITH DIFFERENTIAL/PLATELET
BASOS ABS: 0 10*3/uL (ref 0.0–0.1)
Basophils Relative: 0.6 % (ref 0.0–3.0)
Eosinophils Absolute: 0.1 10*3/uL (ref 0.0–0.7)
Eosinophils Relative: 2 % (ref 0.0–5.0)
HCT: 42.2 % (ref 36.0–46.0)
Hemoglobin: 14.2 g/dL (ref 12.0–15.0)
LYMPHS ABS: 1.8 10*3/uL (ref 0.7–4.0)
Lymphocytes Relative: 36.4 % (ref 12.0–46.0)
MCHC: 33.6 g/dL (ref 30.0–36.0)
MCV: 92.6 fl (ref 78.0–100.0)
MONO ABS: 0.5 10*3/uL (ref 0.1–1.0)
MONOS PCT: 9.2 % (ref 3.0–12.0)
NEUTROS PCT: 51.8 % (ref 43.0–77.0)
Neutro Abs: 2.5 10*3/uL (ref 1.4–7.7)
PLATELETS: 223 10*3/uL (ref 150.0–400.0)
RBC: 4.55 Mil/uL (ref 3.87–5.11)
RDW: 13.5 % (ref 11.5–15.5)
WBC: 4.9 10*3/uL (ref 4.0–10.5)

## 2015-07-22 NOTE — Assessment & Plan Note (Signed)
Chronic problem.  Well controlled.  Asymptomatic.  Check labs.  No anticipated med changes. 

## 2015-07-22 NOTE — Progress Notes (Signed)
Pre visit review using our clinic review tool, if applicable. No additional management support is needed unless otherwise documented below in the visit note. 

## 2015-07-22 NOTE — Addendum Note (Signed)
Addended by: Davis Gourd on: 07/22/2015 09:05 AM   Modules accepted: Orders

## 2015-07-22 NOTE — Patient Instructions (Signed)
Schedule your complete physical in 6 months We'll notify you of your lab results and make any changes if needed Continue to work on healthy diet and regular exercise- you look great! Call with any questions or concerns Have a great summer!!  

## 2015-07-22 NOTE — Assessment & Plan Note (Signed)
New problem at last visit.  Pt was discouraged by the considerable jump in LDL.  Tolerating statin w/o difficulty.  She is attempting to eat better to get off medication.  Check labs.  Adjust meds prn

## 2015-07-22 NOTE — Progress Notes (Signed)
   Subjective:    Patient ID: Lucio Edward, female    DOB: 12-Aug-1955, 60 y.o.   MRN: [id number]  HPI HTN- chronic problem, on Lisinopril daily w/ good BP control.  No CP, SOB, HAs, visual changes, edema.  Hyperlipidemia- chronic problem, on Simvastatin and fish oil.  No abd pain, N/V, myalgias.  No regular exercise.   Review of Systems For ROS see HPI     Objective:   Physical Exam  Constitutional: She is oriented to person, place, and time. She appears well-developed and well-nourished. No distress.  HENT:  Head: Normocephalic and atraumatic.  Eyes: Conjunctivae and EOM are normal. Pupils are equal, round, and reactive to light.  Neck: Normal range of motion. Neck supple. No thyromegaly present.  Cardiovascular: Normal rate, regular rhythm, normal heart sounds and intact distal pulses.   No murmur heard. Pulmonary/Chest: Effort normal and breath sounds normal. No respiratory distress.  Abdominal: Soft. She exhibits no distension. There is no tenderness.  Musculoskeletal: She exhibits no edema.  Lymphadenopathy:    She has no cervical adenopathy.  Neurological: She is alert and oriented to person, place, and time.  Skin: Skin is warm and dry.  Psychiatric: She has a normal mood and affect. Her behavior is normal.  Vitals reviewed.         Assessment & Plan:

## 2015-10-12 ENCOUNTER — Other Ambulatory Visit: Payer: Self-pay | Admitting: Family Medicine

## 2015-12-31 ENCOUNTER — Encounter: Payer: Self-pay | Admitting: Family Medicine

## 2015-12-31 ENCOUNTER — Ambulatory Visit (INDEPENDENT_AMBULATORY_CARE_PROVIDER_SITE_OTHER): Payer: Managed Care, Other (non HMO) | Admitting: Family Medicine

## 2015-12-31 VITALS — BP 117/65 | HR 72 | Temp 98.1°F | Resp 16 | Ht 66.0 in | Wt 150.1 lb

## 2015-12-31 DIAGNOSIS — Z0000 Encounter for general adult medical examination without abnormal findings: Secondary | ICD-10-CM | POA: Diagnosis not present

## 2015-12-31 LAB — BASIC METABOLIC PANEL
BUN: 10 mg/dL (ref 6–23)
CALCIUM: 9.3 mg/dL (ref 8.4–10.5)
CO2: 28 meq/L (ref 19–32)
CREATININE: 0.99 mg/dL (ref 0.40–1.20)
Chloride: 99 mEq/L (ref 96–112)
GFR: 60.68 mL/min (ref >60.00)
GLUCOSE: 85 mg/dL (ref 70–99)
Potassium: 4.5 mEq/L (ref 3.5–5.1)
Sodium: 134 mEq/L — ABNORMAL LOW (ref 135–145)

## 2015-12-31 LAB — HEPATIC FUNCTION PANEL
ALBUMIN: 4.3 g/dL (ref 3.5–5.2)
ALT: 15 U/L (ref 0–35)
AST: 15 U/L (ref 0–37)
Alkaline Phosphatase: 49 U/L (ref 39–117)
Bilirubin, Direct: 0.1 mg/dL (ref 0.0–0.3)
TOTAL PROTEIN: 6.6 g/dL (ref 6.0–8.3)
Total Bilirubin: 0.7 mg/dL (ref 0.2–1.2)

## 2015-12-31 LAB — CBC WITH DIFFERENTIAL/PLATELET
BASOS ABS: 0 10*3/uL (ref 0.0–0.1)
Basophils Relative: 0.4 % (ref 0.0–3.0)
EOS ABS: 0.1 10*3/uL (ref 0.0–0.7)
Eosinophils Relative: 1.5 % (ref 0.0–5.0)
HEMATOCRIT: 39.9 % (ref 36.0–46.0)
HEMOGLOBIN: 13.6 g/dL (ref 12.0–15.0)
LYMPHS PCT: 33.5 % (ref 12.0–46.0)
Lymphs Abs: 1.8 10*3/uL (ref 0.7–4.0)
MCHC: 34 g/dL (ref 30.0–36.0)
MCV: 93.1 fl (ref 78.0–100.0)
MONO ABS: 0.5 10*3/uL (ref 0.1–1.0)
Monocytes Relative: 9.5 % (ref 3.0–12.0)
Neutro Abs: 2.9 10*3/uL (ref 1.4–7.7)
Neutrophils Relative %: 55.1 % (ref 43.0–77.0)
Platelets: 227 10*3/uL (ref 150.0–400.0)
RBC: 4.29 Mil/uL (ref 3.87–5.11)
RDW: 13.3 % (ref 11.5–15.5)
WBC: 5.3 10*3/uL (ref 4.0–10.5)

## 2015-12-31 LAB — LIPID PANEL
CHOLESTEROL: 219 mg/dL — AB (ref 0–200)
HDL: 55.9 mg/dL (ref >39.00)
LDL Cholesterol: 142 mg/dL — ABNORMAL HIGH (ref 0–99)
NONHDL: 163.35
Total CHOL/HDL Ratio: 4
Triglycerides: 106 mg/dL (ref 0.0–149.0)
VLDL: 21.2 mg/dL (ref 0.0–40.0)

## 2015-12-31 LAB — VITAMIN D 25 HYDROXY (VIT D DEFICIENCY, FRACTURES): VITD: 23.33 ng/mL — AB (ref 30.00–100.00)

## 2015-12-31 LAB — TSH: TSH: 4.12 u[IU]/mL (ref 0.35–4.50)

## 2015-12-31 NOTE — Patient Instructions (Signed)
Follow up in 6 months to recheck BP and cholesterol We'll notify you of your lab results and make any changes if needed Continue to work on healthy diet and regular exercise- you look great! Call and schedule your pap Call with any questions or concerns Happy Holidays!!!

## 2015-12-31 NOTE — Assessment & Plan Note (Signed)
Pt's PE WNL.  UTD on mammo, colonoscopy, immunizations.  Pt plans to schedule pap.  Check labs.  Anticipatory guidance provided.

## 2015-12-31 NOTE — Progress Notes (Signed)
   Subjective:    Patient ID: Audrey Glover, female    DOB: 09/13/1955, 60 y.o.   MRN: [id number]  HPI CPE- UTD on mammo, due for pap (Dr Dellis Filbert)   UTD on colonoscopy (due 2019).  UTD on Tdap, flu.   Review of Systems Patient reports no vision/ hearing changes, adenopathy,fever, weight change,  persistant/recurrent hoarseness , swallowing issues, chest pain, palpitations, edema, persistant/recurrent cough, hemoptysis, dyspnea (rest/exertional/paroxysmal nocturnal), gastrointestinal bleeding (melena, rectal bleeding), abdominal pain, significant heartburn, bowel changes, GU symptoms (dysuria, hematuria, incontinence), Gyn symptoms (abnormal  bleeding, pain),  syncope, focal weakness, memory loss, numbness & tingling, skin/hair/nail changes, abnormal bruising or bleeding, anxiety, or depression.     Objective:   Physical Exam General Appearance:    Alert, cooperative, no distress, appears stated age  Head:    Normocephalic, without obvious abnormality, atraumatic  Eyes:    PERRL, conjunctiva/corneas clear, EOM's intact, fundi    benign, both eyes  Ears:    Normal TM's and external ear canals, both ears  Nose:   Nares normal, septum midline, mucosa normal, no drainage    or sinus tenderness  Throat:   Lips, mucosa, and tongue normal; teeth and gums normal  Neck:   Supple, symmetrical, trachea midline, no adenopathy;    Thyroid: no enlargement/tenderness/nodules  Back:     Symmetric, no curvature, ROM normal, no CVA tenderness  Lungs:     Clear to auscultation bilaterally, respirations unlabored  Chest Wall:    No tenderness or deformity   Heart:    Regular rate and rhythm, S1 and S2 normal, no murmur, rub   or gallop  Breast Exam:    Deferred to GYN  Abdomen:     Soft, non-tender, bowel sounds active all four quadrants,    no masses, no organomegaly  Genitalia:    Deferred to GYN  Rectal:    Extremities:   Extremities normal, atraumatic, no cyanosis or edema  Pulses:   2+ and  symmetric all extremities  Skin:   Skin color, texture, turgor normal, no rashes or lesions  Lymph nodes:   Cervical, supraclavicular, and axillary nodes normal  Neurologic:   CNII-XII intact, normal strength, sensation and reflexes    throughout          Assessment & Plan:

## 2015-12-31 NOTE — Progress Notes (Signed)
Pre visit review using our clinic review tool, if applicable. No additional management support is needed unless otherwise documented below in the visit note. 

## 2016-01-01 ENCOUNTER — Other Ambulatory Visit: Payer: Self-pay | Admitting: General Practice

## 2016-01-01 MED ORDER — VITAMIN D (ERGOCALCIFEROL) 1.25 MG (50000 UNIT) PO CAPS: 50000.0000 [IU] | ORAL_CAPSULE | ORAL | 0 refills | Status: DC

## 2016-03-24 ENCOUNTER — Other Ambulatory Visit: Payer: Self-pay | Admitting: Family Medicine

## 2016-03-24 DIAGNOSIS — Z1231 Encounter for screening mammogram for malignant neoplasm of breast: Secondary | ICD-10-CM

## 2016-04-13 ENCOUNTER — Ambulatory Visit
Admission: RE | Admit: 2016-04-13 | Discharge: 2016-04-13 | Disposition: A | Discharge status: Home / Self Care | Payer: Managed Care, Other (non HMO) | Source: Ambulatory Visit | Attending: Family Medicine | Admitting: Family Medicine

## 2016-04-13 ENCOUNTER — Encounter: Payer: Self-pay | Admitting: General Practice

## 2016-04-13 DIAGNOSIS — Z1231 Encounter for screening mammogram for malignant neoplasm of breast: Secondary | ICD-10-CM

## 2016-04-17 ENCOUNTER — Encounter: Payer: Self-pay | Admitting: Family Medicine

## 2016-04-17 ENCOUNTER — Other Ambulatory Visit: Payer: Self-pay | Admitting: Family Medicine

## 2016-04-18 ENCOUNTER — Other Ambulatory Visit: Payer: Self-pay | Admitting: Family Medicine

## 2016-04-19 MED ORDER — ALBUTEROL SULFATE HFA 108 (90 BASE) MCG/ACT IN AERS: 2.0000 | INHALATION_SPRAY | Freq: Four times a day (QID) | RESPIRATORY_TRACT | 2 refills | Status: DC | PRN

## 2016-04-20 ENCOUNTER — Ambulatory Visit (INDEPENDENT_AMBULATORY_CARE_PROVIDER_SITE_OTHER): Payer: 59 | Admitting: Obstetrics & Gynecology

## 2016-04-20 ENCOUNTER — Encounter: Payer: Self-pay | Admitting: Obstetrics & Gynecology

## 2016-04-20 VITALS — BP 118/76 | Ht 65.0 in | Wt 145.0 lb

## 2016-04-20 DIAGNOSIS — Z780 Asymptomatic menopausal state: Secondary | ICD-10-CM | POA: Diagnosis not present

## 2016-04-20 DIAGNOSIS — Z0000 Encounter for general adult medical examination without abnormal findings: Secondary | ICD-10-CM

## 2016-04-20 DIAGNOSIS — Z01419 Encounter for gynecological examination (general) (routine) without abnormal findings: Secondary | ICD-10-CM

## 2016-04-20 NOTE — Patient Instructions (Signed)
Annual exam/Gyn exam normal today.  Pap test done.  Will schedule Bone Density here.  F/U Aex in 1 year.

## 2016-04-20 NOTE — Addendum Note (Signed)
Addended by: Nelva Nay on: 04/20/2016 04:50 PM   Modules accepted: Orders

## 2016-04-20 NOTE — Progress Notes (Signed)
    Audrey Glover 1955/10/10 [id number]        60 y.o.  G0P0000 for annual exam.  G1P0A1 single.  Takes care of her 38 yo mother (recent Dx of Bladder Ca, but low grade).  Menopause.  No HRT.  No PMB.  No pelvic pain.  Breasts wnl.  Mammo 04/2016 neg.  Colono neg 2009.  No recent BD.  Labs with Fam MD.  No SUI or UTI Sx.  BMs wnl.  Past medical history,surgical history, problem list, medications, allergies, family history and social history were all reviewed and documented as reviewed in the EPIC chart.  ROS:  Performed with pertinent positives and negatives included in the history, assessment and plan.   Additional significant findings none.   Exam:  Vitals:   04/20/16 1608  BP: 118/76  Weight: 145 lb (65.8 kg)  Height: 5\' 5"  (1.651 m)   Body mass index is 24.13 kg/m.  General appearance:  Normal affect, orientation and appearance. Skin: Grossly normal HEENT: Without gross lesions.  No cervical or supraclavicular adenopathy. Thyroid normal.  Lungs:  Clear without wheezing, rales or rhonchi Cardiac: RR, without RMG Abdominal:  Soft, nontender, without masses, guarding, rebound, organomegaly or hernia Breasts:  Examined lying and sitting without masses, retractions, discharge or axillary adenopathy. Pelvic:  Ext, BUS, Vagina: wnl  Cervix: wnl, pap done.  Uterus: AV, normal size, shape and contour, midline and mobile nontender   Adnexa: Without masses or tenderness    Anus and perineum: Normal    Assessment/Plan:  61 y.o. G0P0000 female for annual exam.   1. Encounter for wellness examination in adult Pap reflex done.  Mammo 04/2016 neg.  Colono due 2019.  Will schedule BD.  2.  Menopause.  No HRT.  Princess Bruins MD, 4:42 PM 04/20/2016

## 2016-04-23 LAB — PAP IG W/ RFLX HPV ASCU

## 2016-04-24 ENCOUNTER — Encounter: Payer: Self-pay | Admitting: Obstetrics & Gynecology

## 2016-07-02 ENCOUNTER — Ambulatory Visit (INDEPENDENT_AMBULATORY_CARE_PROVIDER_SITE_OTHER): Payer: 59 | Admitting: Family Medicine

## 2016-07-02 ENCOUNTER — Encounter: Payer: Self-pay | Admitting: Family Medicine

## 2016-07-02 VITALS — BP 110/78 | HR 75 | Temp 98.0°F | Resp 16 | Ht 65.0 in | Wt 147.1 lb

## 2016-07-02 DIAGNOSIS — I10 Essential (primary) hypertension: Secondary | ICD-10-CM | POA: Diagnosis not present

## 2016-07-02 DIAGNOSIS — R2242 Localized swelling, mass and lump, left lower limb: Secondary | ICD-10-CM | POA: Diagnosis not present

## 2016-07-02 DIAGNOSIS — M7989 Other specified soft tissue disorders: Secondary | ICD-10-CM

## 2016-07-02 DIAGNOSIS — E785 Hyperlipidemia, unspecified: Secondary | ICD-10-CM | POA: Diagnosis not present

## 2016-07-02 NOTE — Progress Notes (Signed)
Pre visit review using our clinic review tool, if applicable. No additional management support is needed unless otherwise documented below in the visit note. 

## 2016-07-02 NOTE — Patient Instructions (Signed)
Schedule a fasting lab visit at your convenience Schedule your complete physical in 6 months We'll notify you of your lab results and make any changes if needed Continue to work on healthy diet and regular exercise- you can do it!!! We'll call you with your ultrasound appt for the lump on the leg Call with any questions or concerns Have a great summer!!!

## 2016-07-02 NOTE — Progress Notes (Signed)
   Subjective:    Patient ID: Audrey Glover, female    DOB: 03/09/1955, 61 y.o.   MRN: [id number]  HPI HTN- chronic problem, on Lisinopril 10mg  w/ good control.  No CP, SOB, HAs, visual changes, edema.  No abd pain, N/V.  Hyperlipidemia- chronic problem, pt is not taking.  Last LDL was 142.  Pt reports she ate this AM and would like to reschedule labs.  No regular exercise.  L thigh- soft tissue mass.  Pt feels this has been present for at least 10 yrs.  Area is slowly enlarging and recently has become uncomfortable w/ prolonged standing.   Review of Systems For ROS see HPI     Objective:   Physical Exam  Constitutional: She is oriented to person, place, and time. She appears well-developed and well-nourished. No distress.  HENT:  Head: Normocephalic and atraumatic.  Eyes: Conjunctivae and EOM are normal. Pupils are equal, round, and reactive to light.  Neck: Normal range of motion. Neck supple. No thyromegaly present.  Cardiovascular: Normal rate, regular rhythm, normal heart sounds and intact distal pulses.   No murmur heard. Pulmonary/Chest: Effort normal and breath sounds normal. No respiratory distress.  Abdominal: Soft. She exhibits no distension. There is no tenderness.  Musculoskeletal: She exhibits no edema.  3-4 cm mobile soft tissue mass of distal L thigh, no TTP  Lymphadenopathy:    She has no cervical adenopathy.  Neurological: She is alert and oriented to person, place, and time.  Skin: Skin is warm and dry.  Psychiatric: She has a normal mood and affect. Her behavior is normal.  Vitals reviewed.         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  She has never had this imaged and since it has been slowly enlarging, will get [place] to assess.  Pt expressed understanding and is in agreement w/ plan.

## 2016-07-02 NOTE — Assessment & Plan Note (Signed)
Chronic problem.  Well controlled today.  Asymptomatic.  Check labs.  No anticipated med changes. 

## 2016-07-02 NOTE — Assessment & Plan Note (Signed)
Pt did not start Simvastatin as directed.  She admits to not exercising to improve her cholesterol numbers.  Check labs.  Discuss medication if needed.  Will follow.

## 2016-07-15 ENCOUNTER — Ambulatory Visit (HOSPITAL_BASED_OUTPATIENT_CLINIC_OR_DEPARTMENT_OTHER): Payer: 59

## 2016-07-16 ENCOUNTER — Other Ambulatory Visit (INDEPENDENT_AMBULATORY_CARE_PROVIDER_SITE_OTHER): Payer: 59

## 2016-07-16 DIAGNOSIS — E785 Hyperlipidemia, unspecified: Secondary | ICD-10-CM | POA: Diagnosis not present

## 2016-07-16 DIAGNOSIS — I10 Essential (primary) hypertension: Secondary | ICD-10-CM | POA: Diagnosis not present

## 2016-07-16 LAB — BASIC METABOLIC PANEL
BUN: 12 mg/dL (ref 6–23)
CALCIUM: 9.3 mg/dL (ref 8.4–10.5)
CO2: 28 meq/L (ref 19–32)
CREATININE: 1.12 mg/dL (ref 0.40–1.20)
Chloride: 99 mEq/L (ref 96–112)
GFR: 52.53 mL/min — ABNORMAL LOW (ref >60.00)
GLUCOSE: 100 mg/dL — AB (ref 70–99)
Potassium: 5.4 mEq/L — ABNORMAL HIGH (ref 3.5–5.1)
Sodium: 133 mEq/L — ABNORMAL LOW (ref 135–145)

## 2016-07-16 LAB — CBC WITH DIFFERENTIAL/PLATELET
BASOS ABS: 0 10*3/uL (ref 0.0–0.1)
BASOS PCT: 0.9 % (ref 0.0–3.0)
Eosinophils Absolute: 0.1 10*3/uL (ref 0.0–0.7)
Eosinophils Relative: 2.2 % (ref 0.0–5.0)
HEMATOCRIT: 40.6 % (ref 36.0–46.0)
HEMOGLOBIN: 13.9 g/dL (ref 12.0–15.0)
LYMPHS PCT: 35.5 % (ref 12.0–46.0)
Lymphs Abs: 1.7 10*3/uL (ref 0.7–4.0)
MCHC: 34.1 g/dL (ref 30.0–36.0)
MCV: 92.7 fl (ref 78.0–100.0)
MONOS PCT: 9.1 % (ref 3.0–12.0)
Monocytes Absolute: 0.4 10*3/uL (ref 0.1–1.0)
NEUTROS ABS: 2.5 10*3/uL (ref 1.4–7.7)
Neutrophils Relative %: 52.3 % (ref 43.0–77.0)
PLATELETS: 226 10*3/uL (ref 150.0–400.0)
RBC: 4.38 Mil/uL (ref 3.87–5.11)
RDW: 13.6 % (ref 11.5–15.5)
WBC: 4.8 10*3/uL (ref 4.0–10.5)

## 2016-07-16 LAB — LIPID PANEL
CHOL/HDL RATIO: 4
Cholesterol: 219 mg/dL — ABNORMAL HIGH (ref 0–200)
HDL: 56.3 mg/dL (ref >39.00)
LDL Cholesterol: 144 mg/dL — ABNORMAL HIGH (ref 0–99)
NONHDL: 162.69
Triglycerides: 95 mg/dL (ref 0.0–149.0)
VLDL: 19 mg/dL (ref 0.0–40.0)

## 2016-07-16 LAB — HEPATIC FUNCTION PANEL
ALBUMIN: 4.1 g/dL (ref 3.5–5.2)
ALK PHOS: 45 U/L (ref 39–117)
ALT: 17 U/L (ref 0–35)
AST: 17 U/L (ref 0–37)
BILIRUBIN DIRECT: 0.1 mg/dL (ref 0.0–0.3)
Total Bilirubin: 0.7 mg/dL (ref 0.2–1.2)
Total Protein: 6.8 g/dL (ref 6.0–8.3)

## 2016-07-16 LAB — TSH: TSH: 3.49 u[IU]/mL (ref 0.35–4.50)

## 2016-07-26 ENCOUNTER — Ambulatory Visit (HOSPITAL_BASED_OUTPATIENT_CLINIC_OR_DEPARTMENT_OTHER)
Admission: RE | Admit: 2016-07-26 | Discharge: 2016-07-26 | Disposition: A | Discharge status: Home / Self Care | Payer: 59 | Source: Ambulatory Visit | Attending: Family Medicine | Admitting: Family Medicine

## 2016-07-26 DIAGNOSIS — R936 Abnormal findings on diagnostic imaging of limbs: Secondary | ICD-10-CM | POA: Diagnosis not present

## 2016-07-26 DIAGNOSIS — M7989 Other specified soft tissue disorders: Secondary | ICD-10-CM

## 2016-07-26 DIAGNOSIS — R2242 Localized swelling, mass and lump, left lower limb: Secondary | ICD-10-CM | POA: Insufficient documentation

## 2016-07-27 ENCOUNTER — Other Ambulatory Visit: Payer: Self-pay | Admitting: Family Medicine

## 2016-07-27 DIAGNOSIS — E875 Hyperkalemia: Secondary | ICD-10-CM

## 2016-10-12 ENCOUNTER — Other Ambulatory Visit: Payer: Self-pay | Admitting: Family Medicine

## 2017-01-18 ENCOUNTER — Other Ambulatory Visit: Payer: Self-pay

## 2017-01-18 ENCOUNTER — Encounter: Payer: Self-pay | Admitting: Family Medicine

## 2017-01-18 ENCOUNTER — Ambulatory Visit (INDEPENDENT_AMBULATORY_CARE_PROVIDER_SITE_OTHER): Payer: 59 | Admitting: Family Medicine

## 2017-01-18 VITALS — BP 112/84 | HR 78 | Temp 97.8°F | Resp 16 | Ht 65.0 in | Wt 154.1 lb

## 2017-01-18 DIAGNOSIS — E559 Vitamin D deficiency, unspecified: Secondary | ICD-10-CM

## 2017-01-18 DIAGNOSIS — I10 Essential (primary) hypertension: Secondary | ICD-10-CM | POA: Diagnosis not present

## 2017-01-18 DIAGNOSIS — Z0000 Encounter for general adult medical examination without abnormal findings: Secondary | ICD-10-CM | POA: Diagnosis not present

## 2017-01-18 LAB — LIPID PANEL
CHOL/HDL RATIO: 4
Cholesterol: 235 mg/dL — ABNORMAL HIGH (ref 0–200)
HDL: 60.5 mg/dL (ref >39.00)
LDL Cholesterol: 158 mg/dL — ABNORMAL HIGH (ref 0–99)
NONHDL: 174.85
Triglycerides: 85 mg/dL (ref 0.0–149.0)
VLDL: 17 mg/dL (ref 0.0–40.0)

## 2017-01-18 LAB — BASIC METABOLIC PANEL
BUN: 18 mg/dL (ref 6–23)
CALCIUM: 9.5 mg/dL (ref 8.4–10.5)
CO2: 29 mEq/L (ref 19–32)
CREATININE: 1.08 mg/dL (ref 0.40–1.20)
Chloride: 99 mEq/L (ref 96–112)
GFR: 54.69 mL/min — ABNORMAL LOW (ref >60.00)
Glucose, Bld: 88 mg/dL (ref 70–99)
Potassium: 4.7 mEq/L (ref 3.5–5.1)
SODIUM: 135 meq/L (ref 135–145)

## 2017-01-18 LAB — CBC WITH DIFFERENTIAL/PLATELET
BASOS PCT: 1 % (ref 0.0–3.0)
Basophils Absolute: 0 10*3/uL (ref 0.0–0.1)
EOS PCT: 1.8 % (ref 0.0–5.0)
Eosinophils Absolute: 0.1 10*3/uL (ref 0.0–0.7)
HEMATOCRIT: 42.2 % (ref 36.0–46.0)
Hemoglobin: 14.2 g/dL (ref 12.0–15.0)
LYMPHS PCT: 33.8 % (ref 12.0–46.0)
Lymphs Abs: 1.5 10*3/uL (ref 0.7–4.0)
MCHC: 33.6 g/dL (ref 30.0–36.0)
MCV: 94.4 fl (ref 78.0–100.0)
Monocytes Absolute: 0.4 10*3/uL (ref 0.1–1.0)
Monocytes Relative: 9.6 % (ref 3.0–12.0)
Neutro Abs: 2.4 10*3/uL (ref 1.4–7.7)
Neutrophils Relative %: 53.8 % (ref 43.0–77.0)
Platelets: 259 10*3/uL (ref 150.0–400.0)
RBC: 4.47 Mil/uL (ref 3.87–5.11)
RDW: 13.1 % (ref 11.5–15.5)
WBC: 4.5 10*3/uL (ref 4.0–10.5)

## 2017-01-18 LAB — HEPATIC FUNCTION PANEL
ALT: 13 U/L (ref 0–35)
AST: 16 U/L (ref 0–37)
Albumin: 4.5 g/dL (ref 3.5–5.2)
Alkaline Phosphatase: 46 U/L (ref 39–117)
BILIRUBIN TOTAL: 0.6 mg/dL (ref 0.2–1.2)
Bilirubin, Direct: 0.1 mg/dL (ref 0.0–0.3)
Total Protein: 6.7 g/dL (ref 6.0–8.3)

## 2017-01-18 LAB — TSH: TSH: 4.31 u[IU]/mL (ref 0.35–4.50)

## 2017-01-18 LAB — VITAMIN D 25 HYDROXY (VIT D DEFICIENCY, FRACTURES): VITD: 47.25 ng/mL (ref 30.00–100.00)

## 2017-01-18 NOTE — Assessment & Plan Note (Signed)
Chronic problem.  Well controlled today.  Asymptomatic.  Check labs.  No anticipated med changes. 

## 2017-01-18 NOTE — Progress Notes (Signed)
   Subjective:    Patient ID: Audrey Glover, female    DOB: October 16, 1955, 62 y.o.   MRN: [id number]  HPI CPE- UTD on pap, mammo, due for colonoscopy in September.  UTD on flu and Tdap.   Review of Systems Patient reports no vision/ hearing changes, adenopathy,fever, weight change,  persistant/recurrent hoarseness , swallowing issues, chest pain, palpitations, edema, persistant/recurrent cough, hemoptysis, dyspnea (rest/exertional/paroxysmal nocturnal), gastrointestinal bleeding (melena, rectal bleeding), abdominal pain, significant heartburn, bowel changes, GU symptoms (dysuria, hematuria, incontinence), Gyn symptoms (abnormal  bleeding, pain),  syncope, focal weakness, memory loss, numbness & tingling, skin/hair/nail changes, abnormal bruising or bleeding, anxiety, or depression.     Objective:   Physical Exam General Appearance:    Alert, cooperative, no distress, appears stated age  Head:    Normocephalic, without obvious abnormality, atraumatic  Eyes:    PERRL, conjunctiva/corneas clear, EOM's intact, fundi    benign, both eyes  Ears:    Normal TM's and external ear canals, both ears  Nose:   Nares normal, septum midline, mucosa normal, no drainage    or sinus tenderness  Throat:   Lips, mucosa, and tongue normal; teeth and gums normal  Neck:   Supple, symmetrical, trachea midline, no adenopathy;    Thyroid: no enlargement/tenderness/nodules  Back:     Symmetric, no curvature, ROM normal, no CVA tenderness  Lungs:     Clear to auscultation bilaterally, respirations unlabored  Chest Wall:    No tenderness or deformity   Heart:    Regular rate and rhythm, S1 and S2 normal, no murmur, rub   or gallop  Breast Exam:    Deferred to GYN  Abdomen:     Soft, non-tender, bowel sounds active all four quadrants,    no masses, no organomegaly  Genitalia:    Deferred to GYN  Rectal:    Extremities:   Extremities normal, atraumatic, no cyanosis or edema  Pulses:   2+ and symmetric all  extremities  Skin:   Skin color, texture, turgor normal, no rashes or lesions  Lymph nodes:   Cervical, supraclavicular, and axillary nodes normal  Neurologic:   CNII-XII intact, normal strength, sensation and reflexes    throughout          Assessment & Plan:

## 2017-01-18 NOTE — Assessment & Plan Note (Signed)
Pt has hx of low Vit D.  Check labs and replete PRN.

## 2017-01-18 NOTE — Assessment & Plan Note (Signed)
Pt's PE WNL.  UTD on GYN, colonoscopy, immunizations.  Check labs.  Anticipatory guidance provided.  

## 2017-01-18 NOTE — Patient Instructions (Signed)
Follow up in 6 months to recheck BP and cholesterol We'll notify you of your lab results and make any changes if needed Continue to work on healthy diet and regular exercise! Call with any questions or concerns Happy New Year!!

## 2017-01-19 ENCOUNTER — Other Ambulatory Visit: Payer: Self-pay | Admitting: General Practice

## 2017-01-19 DIAGNOSIS — E785 Hyperlipidemia, unspecified: Secondary | ICD-10-CM

## 2017-01-19 MED ORDER — ATORVASTATIN CALCIUM 10 MG PO TABS: 10.0000 mg | ORAL_TABLET | Freq: Every day | ORAL | 3 refills | Status: DC

## 2017-02-27 ENCOUNTER — Other Ambulatory Visit: Payer: Self-pay

## 2017-02-27 ENCOUNTER — Encounter (HOSPITAL_COMMUNITY): Payer: Self-pay | Admitting: *Deleted

## 2017-02-27 ENCOUNTER — Ambulatory Visit (HOSPITAL_COMMUNITY)
Admission: EM | Admit: 2017-02-27 | Discharge: 2017-02-27 | Disposition: A | Discharge status: Home / Self Care | Payer: 59 | Attending: Internal Medicine | Admitting: Internal Medicine

## 2017-02-27 DIAGNOSIS — B349 Viral infection, unspecified: Secondary | ICD-10-CM | POA: Diagnosis not present

## 2017-02-27 DIAGNOSIS — Z87891 Personal history of nicotine dependence: Secondary | ICD-10-CM | POA: Diagnosis not present

## 2017-02-27 DIAGNOSIS — J029 Acute pharyngitis, unspecified: Secondary | ICD-10-CM | POA: Diagnosis present

## 2017-02-27 DIAGNOSIS — Z882 Allergy status to sulfonamides status: Secondary | ICD-10-CM | POA: Diagnosis not present

## 2017-02-27 DIAGNOSIS — R51 Headache: Secondary | ICD-10-CM | POA: Diagnosis present

## 2017-02-27 DIAGNOSIS — R05 Cough: Secondary | ICD-10-CM | POA: Diagnosis present

## 2017-02-27 DIAGNOSIS — Z79899 Other long term (current) drug therapy: Secondary | ICD-10-CM | POA: Diagnosis not present

## 2017-02-27 LAB — POCT RAPID STREP A: STREPTOCOCCUS, GROUP A SCREEN (DIRECT): NEGATIVE

## 2017-02-27 MED ORDER — IPRATROPIUM BROMIDE 0.06 % NA SOLN: 2.0000 | Freq: Four times a day (QID) | NASAL | 0 refills | Status: DC

## 2017-02-27 MED ORDER — FLUTICASONE PROPIONATE 50 MCG/ACT NA SUSP: 2.0000 | Freq: Every day | NASAL | 0 refills | Status: DC

## 2017-02-27 MED ORDER — BENZONATATE 100 MG PO CAPS: 100.0000 mg | ORAL_CAPSULE | Freq: Three times a day (TID) | ORAL | 0 refills | Status: DC

## 2017-02-27 NOTE — ED Provider Notes (Signed)
[facility]    CSN: [id number] Arrival date & time: 02/27/17  1242     History   Chief Complaint Chief Complaint  Patient presents with  . Cough  . Sore Throat  . Headache    HPI Audrey Glover is a 62 y.o. female.   62 year old female with history of anemia, HTN comes in with 4-day history of URI symptoms.  Has had nonproductive cough, rhinorrhea, nasal congestion, sore throat.  Has been taking her temperature, T-max 99.  OTC Tylenol, cold medication without relief.  Negative sick contact.  Former smoker, 10 years, 3 packs/week.  States she has in the past used inhalers during seasonal changes, has not needed to use her inhaler recently.      Past Medical History:  Diagnosis Date  . Allergy   . Anemia   . Hypertension     Patient Active Problem List   Diagnosis Date Noted  . Vitamin D deficiency 01/18/2017  . Hyperlipidemia 07/22/2015  . Routine general medical examination at a health care facility 10/04/2012  . HTN (hypertension) 10/04/2012    Past Surgical History:  Procedure Laterality Date  . BREAST CYST ASPIRATION  07/22/2000  . uterine fibroid removed      OB History    Gravida Para Term Preterm AB Living   0 0 0 0 0 0   SAB TAB Ectopic Multiple Live Births   0 0 0 0 0       Home Medications    Prior to Admission medications   Medication Sig Start Date End Date Taking? Authorizing Provider  albuterol (PROVENTIL HFA;VENTOLIN HFA) 108 (90 Base) MCG/ACT inhaler Inhale 2 puffs into the lungs every 6 (six) hours as needed for wheezing or shortness of breath. 04/19/16  Yes Midge Minium, MD  atorvastatin (LIPITOR) 10 MG tablet Take 1 tablet (10 mg total) by mouth daily. 01/19/17  Yes Midge Minium, MD  CALCIUM-MAGNESIUM-VITAMIN D PO Take 1 tablet by mouth daily.   Yes [provider]  Cholecalciferol (VITAMIN D PO) Take 2,000 Units by mouth.   Yes [provider]  Coenzyme Q10 (CO Q 10 PO) Take by mouth.    Yes [provider]  fish oil-omega-3 fatty acids 1000 MG capsule Take 1 g by mouth every morning.   Yes [provider]  lisinopril (PRINIVIL,ZESTRIL) 10 MG tablet TAKE 1 TABLET EVERY DAY 10/12/16  Yes Midge Minium, MD  loratadine (CLARITIN) 10 MG tablet Take 10 mg by mouth daily as needed for allergies.   Yes [provider]  Multiple Vitamins-Minerals (MULTIVITAMIN ADULT PO) Take by mouth.   Yes [provider]  ranitidine (ZANTAC) 75 MG tablet Take 75 mg by mouth daily as needed.    Yes [provider]  benzonatate (TESSALON) 100 MG capsule Take 1 capsule (100 mg total) by mouth every 8 (eight) hours. 02/27/17   Tasia Catchings, [place]uah Minella V, PA-C  fluticasone (FLONASE) 50 MCG/ACT nasal spray Place 2 sprays into both nostrils daily. 02/27/17   Tasia Catchings, Hoover Grewe V, PA-C  ipratropium (ATROVENT) 0.06 % nasal spray Place 2 sprays into both nostrils 4 (four) times daily. 02/27/17   Ok Edwards, PA-C    Family History Family History  Problem Relation Age of Onset  . Hypertension Mother   . Heart disease Father   . Hypertension Father     Social History Social History   Tobacco Use  . Smoking status: Former Research scientist (life sciences)  . Smokeless tobacco: Never Used  Substance Use Topics  . Alcohol use: Yes    Alcohol/week: 1.2 oz    Types: 2 Glasses of wine per week    Comment: Social  . Drug use: No     Allergies   Sulfa antibiotics   Review of Systems Review of Systems  Reason unable to perform ROS: See HPI as above.     Physical Exam Triage Vital Signs ED Triage Vitals  Enc Vitals Group     BP 02/27/17 1407 123/79     Pulse Rate 02/27/17 1407 98     Resp --      Temp 02/27/17 1407 (!) 97.1 F (36.2 C)     Temp Source 02/27/17 1407 Oral     SpO2 02/27/17 1407 100 %     Weight --      Height --      Head Circumference --      Peak Flow --      Pain Score 02/27/17 1401 2     Pain Loc --      Pain Edu? --      Excl. in [place]? --    No data found.  Updated Vital  Signs BP 123/79 (BP Location: Left Arm)   Pulse 98   Temp (!) 97.1 F (36.2 C) (Oral)   SpO2 100%   Physical Exam  Constitutional: She is oriented to person, place, and time. She appears well-developed and well-nourished. No distress.  HENT:  Head: Normocephalic and atraumatic.  Right Ear: Tympanic membrane, external ear and ear canal normal. Tympanic membrane is not erythematous and not bulging.  Left Ear: Tympanic membrane, external ear and ear canal normal. Tympanic membrane is not erythematous and not bulging.  Nose: Mucosal edema and rhinorrhea present. Right sinus exhibits maxillary sinus tenderness and frontal sinus tenderness. Left sinus exhibits maxillary sinus tenderness and frontal sinus tenderness.  Mouth/Throat: Uvula is midline, oropharynx is clear and moist and mucous membranes are normal.  Eyes: Conjunctivae are normal. Pupils are equal, round, and reactive to light.  Neck: Normal range of motion. Neck supple.  Cardiovascular: Normal rate, regular rhythm and normal heart sounds. Exam reveals no gallop and no friction rub.  No murmur heard. Pulmonary/Chest: Effort normal and breath sounds normal. She has no decreased breath sounds. She has no wheezes. She has no rhonchi. She has no rales.  Lymphadenopathy:    She has no cervical adenopathy.  Neurological: She is alert and oriented to person, place, and time.  Skin: Skin is warm and dry.  Psychiatric: She has a normal mood and affect. Her behavior is normal. Judgment normal.    UC Treatments / Results  Labs (all labs ordered are listed, but only abnormal results are displayed) Labs Reviewed  CULTURE, GROUP A STREP [facility])  POCT RAPID STREP A    EKG  EKG Interpretation None       Radiology No results found.  Procedures Procedures (including critical care time)  Medications Ordered in UC Medications - No data to display   Initial Impression / Assessment and Plan / UC Course  I have reviewed the triage  vital signs and the nursing notes.  Pertinent labs & imaging results that were available during my care of the patient were reviewed by me and considered in my medical decision making (see chart for details).    Rapid strep negative. Symptomatic treatment as needed. Return precautions given.   Final Clinical Impressions(s) / UC Diagnoses   Final diagnoses:  Viral illness  ED Discharge Orders        Ordered    benzonatate (TESSALON) 100 MG capsule  Every 8 hours     02/27/17 1431    fluticasone (FLONASE) 50 MCG/ACT nasal spray  Daily     02/27/17 1431    ipratropium (ATROVENT) 0.06 % nasal spray  4 times daily     02/27/17 1431        Ok Edwards, [place] 02/27/17 1436

## 2017-02-27 NOTE — ED Triage Notes (Signed)
Cough, sneezing, coughing, stuffed head, sore throat,

## 2017-02-27 NOTE — Discharge Instructions (Signed)
Tessalon for cough. Start flonase, atrovent nasal spray for nasal congestion/drainage. You can use over the counter nasal saline rinse such as neti pot for nasal congestion. Keep hydrated, your urine should be clear to pale yellow in color. Tylenol/motrin for fever and pain. Monitor for any worsening of symptoms, chest pain, shortness of breath, wheezing, swelling of the throat, follow up for reevaluation.   For sore throat try using a honey-based tea. Use 3 teaspoons of honey with juice squeezed from half lemon. Place shaved pieces of ginger into 1/2-1 cup of water and warm over stove top. Then mix the ingredients and repeat every 4 hours as needed.

## 2017-02-28 ENCOUNTER — Other Ambulatory Visit: Payer: 59

## 2017-03-02 LAB — CULTURE, GROUP A STREP (THRC)

## 2017-05-07 ENCOUNTER — Other Ambulatory Visit: Payer: Self-pay | Admitting: Family Medicine

## 2017-05-18 ENCOUNTER — Telehealth: Payer: Self-pay | Admitting: General Practice

## 2017-05-18 NOTE — Telephone Encounter (Signed)
Orders are placed. Can you schedule pt?

## 2017-05-18 NOTE — Telephone Encounter (Signed)
Pt is due for LFTs, not lipid panel at this time.  Ok to schedule lab visit

## 2017-05-18 NOTE — Telephone Encounter (Signed)
Please advise. Pt no showed her LFT lab appt on 02/28/17. She has a follow up cholesterol appointment in July.   Copied from [place] [phone]. Topic: Inquiry >> May 17, 2017 12:18 PM Oliver Pila B wrote: Reason for CRM: pt called to get a lipid panel created to check her liver due to a medication, call pt to advise

## 2017-05-19 NOTE — Telephone Encounter (Signed)
Pt states LFT was what she ment, something must have been lost in translation with P[facility]. Pt is scheduled for 05/23/17.

## 2017-05-20 ENCOUNTER — Other Ambulatory Visit: Payer: Self-pay | Admitting: Family Medicine

## 2017-05-23 ENCOUNTER — Other Ambulatory Visit: Payer: 59

## 2017-06-24 ENCOUNTER — Other Ambulatory Visit: Payer: Self-pay | Admitting: Family Medicine

## 2017-06-24 DIAGNOSIS — Z1231 Encounter for screening mammogram for malignant neoplasm of breast: Secondary | ICD-10-CM

## 2017-07-13 ENCOUNTER — Ambulatory Visit
Admission: RE | Admit: 2017-07-13 | Discharge: 2017-07-13 | Disposition: A | Discharge status: Home / Self Care | Payer: 59 | Source: Ambulatory Visit | Attending: Family Medicine | Admitting: Family Medicine

## 2017-07-13 DIAGNOSIS — Z1231 Encounter for screening mammogram for malignant neoplasm of breast: Secondary | ICD-10-CM

## 2017-07-18 ENCOUNTER — Ambulatory Visit: Payer: 59 | Admitting: Family Medicine

## 2017-07-22 ENCOUNTER — Ambulatory Visit: Payer: 59 | Admitting: Family Medicine

## 2017-07-22 ENCOUNTER — Other Ambulatory Visit: Payer: Self-pay

## 2017-07-22 ENCOUNTER — Encounter: Payer: Self-pay | Admitting: Family Medicine

## 2017-07-22 VITALS — BP 114/80 | HR 75 | Temp 98.6°F | Resp 16 | Ht 65.0 in | Wt 152.4 lb

## 2017-07-22 DIAGNOSIS — E785 Hyperlipidemia, unspecified: Secondary | ICD-10-CM

## 2017-07-22 DIAGNOSIS — I10 Essential (primary) hypertension: Secondary | ICD-10-CM | POA: Diagnosis not present

## 2017-07-22 LAB — CBC WITH DIFFERENTIAL/PLATELET
BASOS PCT: 0.8 % (ref 0.0–3.0)
Basophils Absolute: 0 10*3/uL (ref 0.0–0.1)
EOS PCT: 1.3 % (ref 0.0–5.0)
Eosinophils Absolute: 0.1 10*3/uL (ref 0.0–0.7)
HCT: 42.2 % (ref 36.0–46.0)
HEMOGLOBIN: 14.2 g/dL (ref 12.0–15.0)
Lymphocytes Relative: 33.3 % (ref 12.0–46.0)
Lymphs Abs: 1.5 10*3/uL (ref 0.7–4.0)
MCHC: 33.6 g/dL (ref 30.0–36.0)
MCV: 93 fl (ref 78.0–100.0)
MONO ABS: 0.5 10*3/uL (ref 0.1–1.0)
MONOS PCT: 10.2 % (ref 3.0–12.0)
Neutro Abs: 2.5 10*3/uL (ref 1.4–7.7)
Neutrophils Relative %: 54.4 % (ref 43.0–77.0)
Platelets: 229 10*3/uL (ref 150.0–400.0)
RBC: 4.54 Mil/uL (ref 3.87–5.11)
RDW: 13.7 % (ref 11.5–15.5)
WBC: 4.6 10*3/uL (ref 4.0–10.5)

## 2017-07-22 LAB — HEPATIC FUNCTION PANEL
ALT: 15 U/L (ref 0–35)
AST: 15 U/L (ref 0–37)
Albumin: 4.5 g/dL (ref 3.5–5.2)
Alkaline Phosphatase: 46 U/L (ref 39–117)
BILIRUBIN TOTAL: 0.6 mg/dL (ref 0.2–1.2)
Bilirubin, Direct: 0.1 mg/dL (ref 0.0–0.3)
Total Protein: 6.8 g/dL (ref 6.0–8.3)

## 2017-07-22 LAB — LIPID PANEL
CHOL/HDL RATIO: 4
Cholesterol: 264 mg/dL — ABNORMAL HIGH (ref 0–200)
HDL: 65.1 mg/dL (ref >39.00)
LDL Cholesterol: 183 mg/dL — ABNORMAL HIGH (ref 0–99)
NONHDL: 199.29
Triglycerides: 82 mg/dL (ref 0.0–149.0)
VLDL: 16.4 mg/dL (ref 0.0–40.0)

## 2017-07-22 LAB — BASIC METABOLIC PANEL
BUN: 20 mg/dL (ref 6–23)
CHLORIDE: 100 meq/L (ref 96–112)
CO2: 28 mEq/L (ref 19–32)
CREATININE: 1.02 mg/dL (ref 0.40–1.20)
Calcium: 9.4 mg/dL (ref 8.4–10.5)
GFR: 58.32 mL/min — ABNORMAL LOW (ref >60.00)
Glucose, Bld: 84 mg/dL (ref 70–99)
Potassium: 4.8 mEq/L (ref 3.5–5.1)
Sodium: 135 mEq/L (ref 135–145)

## 2017-07-22 LAB — TSH: TSH: 2.98 u[IU]/mL (ref 0.35–4.50)

## 2017-07-22 MED ORDER — FLUTICASONE PROPIONATE 50 MCG/ACT NA SUSP: 2.0000 | Freq: Every day | NASAL | 3 refills | Status: DC

## 2017-07-22 NOTE — Assessment & Plan Note (Signed)
Chronic problem.  Not currently on statin.  Check labs.  Restart meds if needed.

## 2017-07-22 NOTE — Patient Instructions (Signed)
Schedule your complete physical in 6 months We'll notify you of your lab results and make any changes if needed Continue to work on healthy diet and regular exercise- you look great! Call with any questions or concerns Have a great summer!!  

## 2017-07-22 NOTE — Progress Notes (Signed)
   Subjective:    Patient ID: Audrey Glover, female    DOB: 06-25-1955, 62 y.o.   MRN: [id number]  HPI Hyperlipidemia- chronic problem, not taking Lipitor.  Pt had jumped up to 160 lbs and has since lost 8 lbs.  Denies abd pain, N/V, myalgias.  HTN- chronic problem, on Lisinopril 10mg  daily w/ good control.  No CP, SOB, HAs, visual changes, edema.   Review of Systems For ROS see HPI     Objective:   Physical Exam  Constitutional: She is oriented to person, place, and time. She appears well-developed and well-nourished. No distress.  HENT:  Head: Normocephalic and atraumatic.  Eyes: Pupils are equal, round, and reactive to light. Conjunctivae and EOM are normal.  Neck: Normal range of motion. Neck supple. No thyromegaly present.  Cardiovascular: Normal rate, regular rhythm, normal heart sounds and intact distal pulses.  No murmur heard. Pulmonary/Chest: Effort normal and breath sounds normal. No respiratory distress.  Abdominal: Soft. She exhibits no distension. There is no tenderness.  Musculoskeletal: She exhibits no edema.  Lymphadenopathy:    She has no cervical adenopathy.  Neurological: She is alert and oriented to person, place, and time.  Skin: Skin is warm and dry.  Psychiatric: She has a normal mood and affect. Her behavior is normal.  Vitals reviewed.         Assessment & Plan:

## 2017-07-22 NOTE — Assessment & Plan Note (Signed)
Chronic problem.  Excellent control.  Asymptomatic.  Check labs.  No anticipated med changes.  Will follow. 

## 2017-07-25 ENCOUNTER — Other Ambulatory Visit: Payer: Self-pay | Admitting: Family Medicine

## 2017-07-25 DIAGNOSIS — E785 Hyperlipidemia, unspecified: Secondary | ICD-10-CM

## 2017-07-25 MED ORDER — ATORVASTATIN CALCIUM 20 MG PO TABS: 20.0000 mg | ORAL_TABLET | Freq: Every day | ORAL | 3 refills | Status: DC

## 2017-08-25 ENCOUNTER — Telehealth: Payer: Self-pay | Admitting: Emergency Medicine

## 2017-08-25 NOTE — Telephone Encounter (Signed)
Patient scheduled for 09/14/17 for lab visit  Copied from [place] [id number]. Topic: Appointment Scheduling - Scheduling Inquiry for Clinic >> Aug 24, 2017  1:27 PM Audrey Glover wrote: Reason for CRM: Pt called stating that she would like to schedule a hepatic function panel.   Best call back: [phone]

## 2017-09-14 ENCOUNTER — Encounter: Payer: Self-pay | Admitting: General Practice

## 2017-09-14 ENCOUNTER — Other Ambulatory Visit (INDEPENDENT_AMBULATORY_CARE_PROVIDER_SITE_OTHER): Payer: 59

## 2017-09-14 DIAGNOSIS — E785 Hyperlipidemia, unspecified: Secondary | ICD-10-CM | POA: Diagnosis not present

## 2017-09-14 LAB — HEPATIC FUNCTION PANEL
ALT: 18 U/L (ref 0–35)
AST: 16 U/L (ref 0–37)
Albumin: 4.2 g/dL (ref 3.5–5.2)
Alkaline Phosphatase: 41 U/L (ref 39–117)
BILIRUBIN TOTAL: 0.6 mg/dL (ref 0.2–1.2)
Bilirubin, Direct: 0.1 mg/dL (ref 0.0–0.3)
Total Protein: 6.3 g/dL (ref 6.0–8.3)

## 2017-10-15 ENCOUNTER — Other Ambulatory Visit: Payer: Self-pay | Admitting: Family Medicine

## 2017-10-23 ENCOUNTER — Other Ambulatory Visit: Payer: Self-pay | Admitting: Family Medicine

## 2017-11-11 ENCOUNTER — Other Ambulatory Visit: Payer: Self-pay | Admitting: Family Medicine

## 2018-02-06 ENCOUNTER — Other Ambulatory Visit: Payer: Self-pay

## 2018-02-06 ENCOUNTER — Ambulatory Visit (INDEPENDENT_AMBULATORY_CARE_PROVIDER_SITE_OTHER): Payer: 59 | Admitting: Family Medicine

## 2018-02-06 ENCOUNTER — Encounter: Payer: Self-pay | Admitting: General Practice

## 2018-02-06 ENCOUNTER — Encounter: Payer: Self-pay | Admitting: Family Medicine

## 2018-02-06 VITALS — BP 106/83 | HR 72 | Temp 98.3°F | Resp 16 | Ht 65.0 in | Wt 154.4 lb

## 2018-02-06 DIAGNOSIS — Z1211 Encounter for screening for malignant neoplasm of colon: Secondary | ICD-10-CM | POA: Diagnosis not present

## 2018-02-06 DIAGNOSIS — I10 Essential (primary) hypertension: Secondary | ICD-10-CM | POA: Diagnosis not present

## 2018-02-06 DIAGNOSIS — Z0000 Encounter for general adult medical examination without abnormal findings: Secondary | ICD-10-CM

## 2018-02-06 DIAGNOSIS — E559 Vitamin D deficiency, unspecified: Secondary | ICD-10-CM

## 2018-02-06 LAB — LIPID PANEL
CHOL/HDL RATIO: 3
Cholesterol: 157 mg/dL (ref 0–200)
HDL: 61.6 mg/dL (ref >39.00)
LDL CALC: 77 mg/dL (ref 0–99)
NONHDL: 95.72
Triglycerides: 96 mg/dL (ref 0.0–149.0)
VLDL: 19.2 mg/dL (ref 0.0–40.0)

## 2018-02-06 LAB — CBC WITH DIFFERENTIAL/PLATELET
Basophils Absolute: 0 10*3/uL (ref 0.0–0.1)
Basophils Relative: 0.9 % (ref 0.0–3.0)
EOS ABS: 0.1 10*3/uL (ref 0.0–0.7)
EOS PCT: 2 % (ref 0.0–5.0)
HEMATOCRIT: 40.5 % (ref 36.0–46.0)
HEMOGLOBIN: 13.7 g/dL (ref 12.0–15.0)
LYMPHS PCT: 34.1 % (ref 12.0–46.0)
Lymphs Abs: 1.6 10*3/uL (ref 0.7–4.0)
MCHC: 33.9 g/dL (ref 30.0–36.0)
MCV: 93.6 fl (ref 78.0–100.0)
MONOS PCT: 10.4 % (ref 3.0–12.0)
Monocytes Absolute: 0.5 10*3/uL (ref 0.1–1.0)
Neutro Abs: 2.5 10*3/uL (ref 1.4–7.7)
Neutrophils Relative %: 52.6 % (ref 43.0–77.0)
Platelets: 232 10*3/uL (ref 150.0–400.0)
RBC: 4.32 Mil/uL (ref 3.87–5.11)
RDW: 13.1 % (ref 11.5–15.5)
WBC: 4.7 10*3/uL (ref 4.0–10.5)

## 2018-02-06 LAB — BASIC METABOLIC PANEL
BUN: 10 mg/dL (ref 6–23)
CALCIUM: 9.6 mg/dL (ref 8.4–10.5)
CO2: 29 mEq/L (ref 19–32)
Chloride: 102 mEq/L (ref 96–112)
Creatinine, Ser: 1.01 mg/dL (ref 0.40–1.20)
GFR: 55.4 mL/min — AB (ref >60.00)
Glucose, Bld: 89 mg/dL (ref 70–99)
Potassium: 4.9 mEq/L (ref 3.5–5.1)
SODIUM: 137 meq/L (ref 135–145)

## 2018-02-06 LAB — HEPATIC FUNCTION PANEL
ALT: 17 U/L (ref 0–35)
AST: 15 U/L (ref 0–37)
Albumin: 4.3 g/dL (ref 3.5–5.2)
Alkaline Phosphatase: 45 U/L (ref 39–117)
BILIRUBIN DIRECT: 0.1 mg/dL (ref 0.0–0.3)
BILIRUBIN TOTAL: 0.5 mg/dL (ref 0.2–1.2)
Total Protein: 6.4 g/dL (ref 6.0–8.3)

## 2018-02-06 LAB — TSH: TSH: 4.23 u[IU]/mL (ref 0.35–4.50)

## 2018-02-06 LAB — VITAMIN D 25 HYDROXY (VIT D DEFICIENCY, FRACTURES): VITD: 50.97 ng/mL (ref 30.00–100.00)

## 2018-02-06 NOTE — Assessment & Plan Note (Signed)
Pt has hx of this.  Check labs.  Replete prn. 

## 2018-02-06 NOTE — Assessment & Plan Note (Signed)
Pt's PE WNL.  UTD on pap, mammo, immunizations.  Due for colon cancer screen.  Last colonoscopy did not have any polyps so pt is eligible for cologuard.  This is what she prefers.  Order entered via portal.  Check labs.  Anticipatory guidance provided.

## 2018-02-06 NOTE — Patient Instructions (Addendum)
Follow up in 6 months to recheck BP and cholesterol We'll notify you of your lab results and make any changes if needed Keep up the good work on healthy diet and regular exercise- you look great! Complete the cologuard once it arrives and return as directed Call with any questions or concerns Happy New Year!

## 2018-02-06 NOTE — Progress Notes (Signed)
   Subjective:    Patient ID: Audrey Glover, female    DOB: 1955/07/10, 63 y.o.   MRN: [id number]  HPI CPE- UTD on mammo, pap, immunizations.  Due for colonoscopy.   Review of Systems Patient reports no vision/ hearing changes, adenopathy,fever, weight change,  persistant/recurrent hoarseness , swallowing issues, chest pain, palpitations, edema, persistant/recurrent cough, hemoptysis, dyspnea (rest/exertional/paroxysmal nocturnal), gastrointestinal bleeding (melena, rectal bleeding), abdominal pain, significant heartburn, bowel changes, GU symptoms (dysuria, hematuria, incontinence), Gyn symptoms (abnormal  bleeding, pain),  syncope, focal weakness, memory loss, numbness & tingling, skin/hair/nail changes, abnormal bruising or bleeding, anxiety, or depression.     Objective:   Physical Exam General Appearance:    Alert, cooperative, no distress, appears stated age  Head:    Normocephalic, without obvious abnormality, atraumatic  Eyes:    PERRL, conjunctiva/corneas clear, EOM's intact, fundi    benign, both eyes  Ears:    Normal TM's and external ear canals, both ears  Nose:   Nares normal, septum midline, mucosa normal, no drainage    or sinus tenderness  Throat:   Lips, mucosa, and tongue normal; teeth and gums normal  Neck:   Supple, symmetrical, trachea midline, no adenopathy;    Thyroid: no enlargement/tenderness/nodules  Back:     Symmetric, no curvature, ROM normal, no CVA tenderness  Lungs:     Clear to auscultation bilaterally, respirations unlabored  Chest Wall:    No tenderness or deformity   Heart:    Regular rate and rhythm, S1 and S2 normal, no murmur, rub   or gallop  Breast Exam:    Deferred to GYN  Abdomen:     Soft, non-tender, bowel sounds active all four quadrants,    no masses, no organomegaly  Genitalia:    Deferred to GYN  Rectal:    Extremities:   Extremities normal, atraumatic, no cyanosis or edema  Pulses:   2+ and symmetric all extremities  Skin:    Skin color, texture, turgor normal, no rashes or lesions  Lymph nodes:   Cervical, supraclavicular, and axillary nodes normal  Neurologic:   CNII-XII intact, normal strength, sensation and reflexes    throughout          Assessment & Plan:

## 2018-02-06 NOTE — Assessment & Plan Note (Signed)
Chronic problem.  Well controlled.  Asymptomatic.  Check labs.  No anticipated med changes.  Will follow. 

## 2018-03-21 ENCOUNTER — Other Ambulatory Visit: Payer: Self-pay

## 2018-03-21 ENCOUNTER — Encounter: Payer: Self-pay | Admitting: Physician Assistant

## 2018-03-21 ENCOUNTER — Ambulatory Visit: Payer: 59 | Admitting: Physician Assistant

## 2018-03-21 VITALS — BP 121/81 | HR 87 | Temp 98.6°F | Resp 16 | Ht 65.0 in | Wt 153.0 lb

## 2018-03-21 DIAGNOSIS — R109 Unspecified abdominal pain: Secondary | ICD-10-CM

## 2018-03-21 LAB — POCT URINALYSIS DIPSTICK
Bilirubin, UA: NEGATIVE
Glucose, UA: NEGATIVE
Ketones, UA: NEGATIVE
Leukocytes, UA: NEGATIVE
NITRITE UA: NEGATIVE
PH UA: 5 (ref 5.0–8.0)
PROTEIN UA: NEGATIVE
Spec Grav, UA: 1.025 (ref 1.010–1.025)
Urobilinogen, UA: 0.2 E.U./dL

## 2018-03-21 MED ORDER — TRAMADOL HCL 50 MG PO TABS: 50.0000 mg | ORAL_TABLET | Freq: Three times a day (TID) | ORAL | 0 refills | Status: DC | PRN

## 2018-03-21 NOTE — Progress Notes (Signed)
Patient presents to clinic today c/o 1 week of left-sided thoracic back pain.Endorses pain is aching but more sharp since last evening. Denies trauma or injury. Denies heavy lifting. Denies pain worse with ROM. Denies dysuria, hematuria, nausea or vomiting. Pain sometimes radiates into left pelvic/inguinal region.Denies pleuritic component.  Denies history nephrolithiasis or pyelonephritis. Notes some intermittent constipation over the past month but currently bowels are regular. Denies melena, hematochezia or tenesmus. Last Tylenol at 8:30 AM today.   Past Medical History:  Diagnosis Date  . Allergy   . Anemia   . Hypertension     Current Outpatient Medications on File Prior to Visit  Medication Sig Dispense Refill  . albuterol (PROVENTIL HFA;VENTOLIN HFA) 108 (90 Base) MCG/ACT inhaler Inhale 2 puffs into the lungs every 6 (six) hours as needed for wheezing or shortness of breath. 1 Inhaler 2  . atorvastatin (LIPITOR) 20 MG tablet TAKE 1 TABLET BY MOUTH EVERY DAY 90 tablet 1  . CALCIUM-MAGNESIUM-VITAMIN D PO Take 1 tablet by mouth daily.    . Cholecalciferol (VITAMIN D PO) Take 2,000 Units by mouth.    . Coenzyme Q10 (CO Q 10 PO) Take by mouth.    . fish oil-omega-3 fatty acids 1000 MG capsule Take 1 g by mouth every morning.    . fluticasone (FLONASE) 50 MCG/ACT nasal spray SPRAY 2 SPRAYS INTO EACH NOSTRIL EVERY DAY 48 g 1  . lisinopril (PRINIVIL,ZESTRIL) 10 MG tablet TAKE 1 TABLET EVERY DAY 90 tablet 1  . loratadine (CLARITIN) 10 MG tablet Take 10 mg by mouth daily as needed for allergies.    . Multiple Vitamins-Minerals (MULTIVITAMIN ADULT PO) Take by mouth.    . ranitidine (ZANTAC) 75 MG tablet Take 75 mg by mouth daily as needed.      No current facility-administered medications on file prior to visit.     Allergies  Allergen Reactions  . Sulfa Antibiotics Rash    Family History  Problem Relation Age of Onset  . Hypertension Mother   . Heart disease Father   .  Hypertension Father     Social History   Socioeconomic History  . Marital status: Single    Spouse name: Not on file  . Number of children: Not on file  . Years of education: Not on file  . Highest education level: Not on file  Occupational History  . Not on file  Social Needs  . Financial resource strain: Not on file  . Food insecurity:    Worry: Not on file    Inability: Not on file  . Transportation needs:    Medical: Not on file    Non-medical: Not on file  Tobacco Use  . Smoking status: Former Research scientist (life sciences)  . Smokeless tobacco: Never Used  Substance and Sexual Activity  . Alcohol use: Yes    Alcohol/week: 2.0 standard drinks    Types: 2 Glasses of wine per week    Comment: Social  . Drug use: No  . Sexual activity: Never  Lifestyle  . Physical activity:    Days per week: Not on file    Minutes per session: Not on file  . Stress: Not on file  Relationships  . Social connections:    Talks on phone: Not on file    Gets together: Not on file    Attends religious service: Not on file    Active member of club or organization: Not on file    Attends meetings of clubs or organizations: Not on  file    Relationship status: Not on file  Other Topics Concern  . Not on file  Social History Narrative  . Not on file   Review of Systems - See HPI.  All other ROS are negative.  BP 121/81   Pulse 87   Temp 98.6 F (37 C) (Oral)   Resp 16   Ht 5\' 5"  (1.651 m)   Wt 153 lb (69.4 kg)   SpO2 97%   BMI 25.46 kg/m   Physical Exam Vitals signs reviewed.  Constitutional:      Appearance: Normal appearance.  HENT:     Head: Normocephalic and atraumatic.     Nose: Nose normal.     Mouth/Throat:     Mouth: Mucous membranes are moist.  Neck:     Musculoskeletal: Neck supple.  Cardiovascular:     Rate and Rhythm: Normal rate and regular rhythm.     Heart sounds: Normal heart sounds.  Pulmonary:     Effort: Pulmonary effort is normal.  Abdominal:     General: Bowel sounds  are normal. There is no distension.     Palpations: Abdomen is soft.     Tenderness: There is no abdominal tenderness. There is no right CVA tenderness or left CVA tenderness.  Musculoskeletal:     Thoracic back: She exhibits pain. She exhibits normal range of motion, no tenderness, no bony tenderness and no swelling.     Comments: Pain not reproducible with palpation, percussion or ROM. Pain is lateral left side/flank.  Neurological:     Mental Status: She is alert.  Psychiatric:        Mood and Affect: Mood normal.     Recent Results (from the past 2160 hour(s))  Lipid panel     Status: None   Collection Time: 02/06/18  9:36 AM  Result Value Ref Range   Cholesterol 157 0 - 200 mg/dL    Comment: ATP III Classification       Desirable:  < 200 mg/dL               Borderline High:  200 - 239 mg/dL          High:  > = 240 mg/dL   Triglycerides 96.0 0.0 - 149.0 mg/dL    Comment: Normal:  <150 mg/dLBorderline High:  150 - 199 mg/dL   HDL 61.60 >39.00 mg/dL   VLDL 19.2 0.0 - 40.0 mg/dL   LDL Cholesterol 77 0 - 99 mg/dL   Total CHOL/HDL Ratio 3     Comment:                Men          Women1/2 Average Risk     3.4          3.3Average Risk          5.0          4.42X Average Risk          9.6          7.13X Average Risk          15.0          11.0                       NonHDL 95.72     Comment: NOTE:  Non-HDL goal should be 30 mg/dL higher than patient's LDL goal (i.e. LDL goal of < 70 mg/dL, would have non-HDL goal of <  100 mg/dL)  Basic metabolic panel     Status: Abnormal   Collection Time: 02/06/18  9:36 AM  Result Value Ref Range   Sodium 137 135 - 145 mEq/L   Potassium 4.9 3.5 - 5.1 mEq/L   Chloride 102 96 - 112 mEq/L   CO2 29 19 - 32 mEq/L   Glucose, Bld 89 70 - 99 mg/dL   BUN 10 6 - 23 mg/dL   Creatinine, Ser 1.01 0.40 - 1.20 mg/dL   Calcium 9.6 8.4 - 10.5 mg/dL   GFR 55.40 (L) >60.00 mL/min  TSH     Status: None   Collection Time: 02/06/18  9:36 AM  Result Value Ref Range     TSH 4.23 0.35 - 4.50 uIU/mL  Hepatic function panel     Status: None   Collection Time: 02/06/18  9:36 AM  Result Value Ref Range   Total Bilirubin 0.5 0.2 - 1.2 mg/dL   Bilirubin, Direct 0.1 0.0 - 0.3 mg/dL   Alkaline Phosphatase 45 39 - 117 U/L   AST 15 0 - 37 U/L   ALT 17 0 - 35 U/L   Total Protein 6.4 6.0 - 8.3 g/dL   Albumin 4.3 3.5 - 5.2 g/dL  CBC with Differential/Platelet     Status: None   Collection Time: 02/06/18  9:36 AM  Result Value Ref Range   WBC 4.7 4.0 - 10.5 K/uL   RBC 4.32 3.87 - 5.11 Mil/uL   Hemoglobin 13.7 12.0 - 15.0 g/dL   HCT 40.5 36.0 - 46.0 %   MCV 93.6 78.0 - 100.0 fl   MCHC 33.9 30.0 - 36.0 g/dL   RDW 13.1 11.5 - 15.5 %   Platelets 232.0 150.0 - 400.0 K/uL   Neutrophils Relative % 52.6 43.0 - 77.0 %   Lymphocytes Relative 34.1 12.0 - 46.0 %   Monocytes Relative 10.4 3.0 - 12.0 %   Eosinophils Relative 2.0 0.0 - 5.0 %   Basophils Relative 0.9 0.0 - 3.0 %   Neutro Abs 2.5 1.4 - 7.7 K/uL   Lymphs Abs 1.6 0.7 - 4.0 K/uL   Monocytes Absolute 0.5 0.1 - 1.0 K/uL   Eosinophils Absolute 0.1 0.0 - 0.7 K/uL   Basophils Absolute 0.0 0.0 - 0.1 K/uL  VITAMIN D 25 Hydroxy (Vit-D Deficiency, Fractures)     Status: None   Collection Time: 02/06/18  9:36 AM  Result Value Ref Range   VITD 50.97 30.00 - 100.00 ng/mL   Assessment/Plan: 1. Left flank pain Pain not reproducible with palpation or range of motion. No CVA tenderness. Blood noted in urine. Need to r/o stone. Rx Tramadol. Will obtain CT renal stone study. If able to pass, will start Flomax. Otherwise refer to Urology.  - POCT urinalysis dipstick - CT RENAL STONE STUDY; Future   Leeanne Rio, PA-C

## 2018-03-21 NOTE — Patient Instructions (Addendum)
Please keep well-hydrated. I am having you get a CT scan to assess for stone giving current symptoms and blood found in urine.  Please use the Tylenol for milder pain. Use the Tramadol as directed, if needed for more significant pain.  We will call as soon as we get results. If small stone, we will start a medication to help you expel the stone.  If larger, we will have you see Urology.  Consider a daily Miralax to help regulate your stools overall!

## 2018-03-24 ENCOUNTER — Other Ambulatory Visit: Payer: Self-pay

## 2018-03-24 ENCOUNTER — Ambulatory Visit: Payer: 59 | Admitting: Family Medicine

## 2018-03-24 ENCOUNTER — Encounter: Payer: Self-pay | Admitting: Family Medicine

## 2018-03-24 VITALS — BP 118/80 | HR 100 | Temp 98.3°F | Ht 65.0 in | Wt 152.0 lb

## 2018-03-24 DIAGNOSIS — B029 Zoster without complications: Secondary | ICD-10-CM

## 2018-03-24 MED ORDER — GABAPENTIN 400 MG PO CAPS: 400.0000 mg | ORAL_CAPSULE | Freq: Three times a day (TID) | ORAL | 0 refills | Status: DC

## 2018-03-24 MED ORDER — VALACYCLOVIR HCL 500 MG PO TABS: 1000.0000 mg | ORAL_TABLET | Freq: Three times a day (TID) | ORAL | 0 refills | Status: DC

## 2018-03-24 NOTE — Patient Instructions (Signed)
The biggest side effect of the gabapentin, to help with the pain/discomfort, is drowsiness. If it makes you very sleepy, let me know.  Topical capsaicin cream has been shown to help symptoms as well.  OK to take Tylenol 1000 mg (2 extra strength tabs) or 975 mg (3 regular strength tabs) every 6 hours as needed.   Ice/cold pack over area for 10-15 min twice daily.  Let us know if you need anything.

## 2018-03-24 NOTE — Progress Notes (Signed)
Chief Complaint  Patient presents with  . Back Pain  . Rash    Audrey Glover is a 63 y.o. female here for a skin complaint.  Duration: 8 days pain, rash started yesterday Location: L flank radiating around to abd Pruritic? Yes Painful? Yes- burning Drainage? No New soaps/lotions/topicals/detergents? No Sick contacts? No Other associated symptoms: none Therapies tried thus far: Tylenol  ROS:  Const: No fevers Skin: As noted in HPI  Past Medical History:  Diagnosis Date  . Allergy   . Anemia   . Hypertension     BP 118/80 (BP Location: Left Arm, Patient Position: Sitting, Cuff Size: Normal)   Pulse 100   Temp 98.3 F (36.8 C) (Oral)   Ht 5\' 5"  (1.651 m)   Wt 152 lb (68.9 kg)   SpO2 95%   BMI 25.29 kg/m  Gen: awake, alert, appearing stated age Lungs: No accessory muscle use Skin: see below. No drainage, fluctuance, excoriation Psych: Age appropriate judgment and insight.   L abd wall   L lower back   Herpes zoster without complication - Plan: valACYclovir (VALTREX) 500 MG tablet, gabapentin (NEURONTIN) 400 MG capsule  Orders as above. Topical capsaicin. Tylenol. F/u prn. The patient voiced understanding and agreement to the plan.  Gage, DO 03/24/18 1:37 PM

## 2018-04-07 ENCOUNTER — Other Ambulatory Visit: Payer: Self-pay | Admitting: Family Medicine

## 2018-04-30 ENCOUNTER — Other Ambulatory Visit: Payer: Self-pay | Admitting: Family Medicine

## 2018-08-02 ENCOUNTER — Other Ambulatory Visit: Payer: Self-pay | Admitting: Family Medicine

## 2018-08-02 DIAGNOSIS — Z1231 Encounter for screening mammogram for malignant neoplasm of breast: Secondary | ICD-10-CM

## 2018-08-07 ENCOUNTER — Other Ambulatory Visit: Payer: Self-pay

## 2018-08-07 ENCOUNTER — Ambulatory Visit: Payer: 59 | Admitting: Family Medicine

## 2018-08-07 ENCOUNTER — Encounter: Payer: Self-pay | Admitting: Family Medicine

## 2018-08-07 VITALS — BP 122/78 | HR 80 | Temp 97.9°F | Resp 16 | Ht 65.0 in | Wt 151.2 lb

## 2018-08-07 DIAGNOSIS — I10 Essential (primary) hypertension: Secondary | ICD-10-CM | POA: Diagnosis not present

## 2018-08-07 DIAGNOSIS — E785 Hyperlipidemia, unspecified: Secondary | ICD-10-CM | POA: Diagnosis not present

## 2018-08-07 LAB — LIPID PANEL
Cholesterol: 256 mg/dL — ABNORMAL HIGH (ref 0–200)
HDL: 58.5 mg/dL (ref >39.00)
LDL Cholesterol: 178 mg/dL — ABNORMAL HIGH (ref 0–99)
NonHDL: 197.94
Total CHOL/HDL Ratio: 4
Triglycerides: 102 mg/dL (ref 0.0–149.0)
VLDL: 20.4 mg/dL (ref 0.0–40.0)

## 2018-08-07 LAB — CBC WITH DIFFERENTIAL/PLATELET
Basophils Absolute: 0 10*3/uL (ref 0.0–0.1)
Basophils Relative: 0.6 % (ref 0.0–3.0)
Eosinophils Absolute: 0 10*3/uL (ref 0.0–0.7)
Eosinophils Relative: 0.9 % (ref 0.0–5.0)
HCT: 40.8 % (ref 36.0–46.0)
Hemoglobin: 13.8 g/dL (ref 12.0–15.0)
Lymphocytes Relative: 33.8 % (ref 12.0–46.0)
Lymphs Abs: 1.7 10*3/uL (ref 0.7–4.0)
MCHC: 33.9 g/dL (ref 30.0–36.0)
MCV: 93.7 fl (ref 78.0–100.0)
Monocytes Absolute: 0.4 10*3/uL (ref 0.1–1.0)
Monocytes Relative: 8.3 % (ref 3.0–12.0)
Neutro Abs: 2.9 10*3/uL (ref 1.4–7.7)
Neutrophils Relative %: 56.4 % (ref 43.0–77.0)
Platelets: 242 10*3/uL (ref 150.0–400.0)
RBC: 4.36 Mil/uL (ref 3.87–5.11)
RDW: 12.8 % (ref 11.5–15.5)
WBC: 5.1 10*3/uL (ref 4.0–10.5)

## 2018-08-07 LAB — HEPATIC FUNCTION PANEL
ALT: 23 U/L (ref 0–35)
AST: 20 U/L (ref 0–37)
Albumin: 4.6 g/dL (ref 3.5–5.2)
Alkaline Phosphatase: 52 U/L (ref 39–117)
Bilirubin, Direct: 0.1 mg/dL (ref 0.0–0.3)
Total Bilirubin: 0.7 mg/dL (ref 0.2–1.2)
Total Protein: 6.8 g/dL (ref 6.0–8.3)

## 2018-08-07 LAB — BASIC METABOLIC PANEL
BUN: 14 mg/dL (ref 6–23)
CO2: 28 mEq/L (ref 19–32)
Calcium: 9.9 mg/dL (ref 8.4–10.5)
Chloride: 98 mEq/L (ref 96–112)
Creatinine, Ser: 1.08 mg/dL (ref 0.40–1.20)
GFR: 51.2 mL/min — ABNORMAL LOW (ref >60.00)
Glucose, Bld: 84 mg/dL (ref 70–99)
Potassium: 5 mEq/L (ref 3.5–5.1)
Sodium: 132 mEq/L — ABNORMAL LOW (ref 135–145)

## 2018-08-07 NOTE — Assessment & Plan Note (Signed)
Chronic problem.  Well controlled.  Asymptomatic.  Check labs.  No anticipated med changes.  Will follow. 

## 2018-08-07 NOTE — Assessment & Plan Note (Signed)
Chronic problem.  Pt did not tolerate Lipitor.  Check labs and if statin is required will start low dose Crestor.  Pt expressed understanding and is in agreement w/ plan.

## 2018-08-07 NOTE — Patient Instructions (Signed)
Schedule your complete physical in 6 months We'll notify you of your lab results and make any changes if needed Continue to work on healthy diet and regular exercise- you look great!! Call with any questions or concerns Stay safe!!!

## 2018-08-07 NOTE — Progress Notes (Signed)
   Subjective:    Patient ID: Audrey Glover, female    DOB: 1955-10-28, 63 y.o.   MRN: [id number]  HPI HTN- chronic problem, on Lisinopril 10mg  daily w/ good control.  No CP, SOB, HAs, visual changes, edema.  Hyperlipidemia- chronic problem, on Lipitor 20mg  daily but stopped in April due to reported hair loss and constipation.  No abd pain, N/V.  Walking 'a couple of times a week'.   Review of Systems For ROS see HPI     Objective:   Physical Exam Vitals signs reviewed.  Constitutional:      General: She is not in acute distress.    Appearance: She is well-developed.  HENT:     Head: Normocephalic and atraumatic.  Eyes:     Conjunctiva/sclera: Conjunctivae normal.     Pupils: Pupils are equal, round, and reactive to light.  Neck:     Musculoskeletal: Normal range of motion and neck supple.     Thyroid: No thyromegaly.  Cardiovascular:     Rate and Rhythm: Normal rate and regular rhythm.     Heart sounds: Normal heart sounds. No murmur.  Pulmonary:     Effort: Pulmonary effort is normal. No respiratory distress.     Breath sounds: Normal breath sounds.  Abdominal:     General: There is no distension.     Palpations: Abdomen is soft.     Tenderness: There is no abdominal tenderness.  Lymphadenopathy:     Cervical: No cervical adenopathy.  Skin:    General: Skin is warm and dry.  Neurological:     Mental Status: She is alert and oriented to person, place, and time.  Psychiatric:        Behavior: Behavior normal.           Assessment & Plan:

## 2018-08-08 ENCOUNTER — Other Ambulatory Visit: Payer: Self-pay | Admitting: General Practice

## 2018-08-08 DIAGNOSIS — E785 Hyperlipidemia, unspecified: Secondary | ICD-10-CM

## 2018-08-08 MED ORDER — ROSUVASTATIN CALCIUM 10 MG PO TABS: 10.0000 mg | ORAL_TABLET | Freq: Every day | ORAL | 6 refills | Status: DC

## 2018-09-15 ENCOUNTER — Other Ambulatory Visit: Payer: Self-pay

## 2018-09-15 ENCOUNTER — Ambulatory Visit
Admission: RE | Admit: 2018-09-15 | Discharge: 2018-09-15 | Disposition: A | Discharge status: Home / Self Care | Payer: 59 | Source: Ambulatory Visit | Attending: Family Medicine | Admitting: Family Medicine

## 2018-09-15 DIAGNOSIS — Z1231 Encounter for screening mammogram for malignant neoplasm of breast: Secondary | ICD-10-CM

## 2018-09-15 HISTORY — DX: Other signs and symptoms in breast: N64.59

## 2018-10-20 ENCOUNTER — Encounter: Payer: Self-pay | Admitting: General Practice

## 2018-10-20 ENCOUNTER — Ambulatory Visit (INDEPENDENT_AMBULATORY_CARE_PROVIDER_SITE_OTHER): Payer: 59

## 2018-10-20 ENCOUNTER — Other Ambulatory Visit: Payer: Self-pay

## 2018-10-20 DIAGNOSIS — Z23 Encounter for immunization: Secondary | ICD-10-CM

## 2018-10-20 DIAGNOSIS — E785 Hyperlipidemia, unspecified: Secondary | ICD-10-CM | POA: Diagnosis not present

## 2018-10-20 LAB — HEPATIC FUNCTION PANEL
ALT: 19 U/L (ref 0–35)
AST: 17 U/L (ref 0–37)
Albumin: 4.5 g/dL (ref 3.5–5.2)
Alkaline Phosphatase: 47 U/L (ref 39–117)
Bilirubin, Direct: 0.1 mg/dL (ref 0.0–0.3)
Total Bilirubin: 0.7 mg/dL (ref 0.2–1.2)
Total Protein: 6.8 g/dL (ref 6.0–8.3)

## 2018-10-27 ENCOUNTER — Other Ambulatory Visit: Payer: Self-pay | Admitting: Family Medicine

## 2018-12-29 ENCOUNTER — Other Ambulatory Visit: Payer: Self-pay | Admitting: Family Medicine

## 2019-01-12 HISTORY — PX: BREAST BIOPSY: SHX20

## 2019-02-05 ENCOUNTER — Other Ambulatory Visit: Payer: Self-pay | Admitting: Family Medicine

## 2019-02-09 ENCOUNTER — Other Ambulatory Visit: Payer: Self-pay

## 2019-02-09 ENCOUNTER — Encounter: Payer: Self-pay | Admitting: Family Medicine

## 2019-02-09 ENCOUNTER — Ambulatory Visit (INDEPENDENT_AMBULATORY_CARE_PROVIDER_SITE_OTHER): Payer: Managed Care, Other (non HMO) | Admitting: Family Medicine

## 2019-02-09 VITALS — BP 124/81 | HR 71 | Temp 97.9°F | Resp 16 | Ht 65.0 in | Wt 155.4 lb

## 2019-02-09 DIAGNOSIS — Z0000 Encounter for general adult medical examination without abnormal findings: Secondary | ICD-10-CM

## 2019-02-09 DIAGNOSIS — E559 Vitamin D deficiency, unspecified: Secondary | ICD-10-CM | POA: Diagnosis not present

## 2019-02-09 DIAGNOSIS — I10 Essential (primary) hypertension: Secondary | ICD-10-CM | POA: Diagnosis not present

## 2019-02-09 LAB — CBC WITH DIFFERENTIAL/PLATELET
Basophils Absolute: 0 10*3/uL (ref 0.0–0.1)
Basophils Relative: 0.7 % (ref 0.0–3.0)
Eosinophils Absolute: 0.1 10*3/uL (ref 0.0–0.7)
Eosinophils Relative: 1.2 % (ref 0.0–5.0)
HCT: 40.1 % (ref 36.0–46.0)
Hemoglobin: 13.6 g/dL (ref 12.0–15.0)
Lymphocytes Relative: 31.6 % (ref 12.0–46.0)
Lymphs Abs: 1.6 10*3/uL (ref 0.7–4.0)
MCHC: 34 g/dL (ref 30.0–36.0)
MCV: 93.5 fl (ref 78.0–100.0)
Monocytes Absolute: 0.4 10*3/uL (ref 0.1–1.0)
Monocytes Relative: 8.5 % (ref 3.0–12.0)
Neutro Abs: 3 10*3/uL (ref 1.4–7.7)
Neutrophils Relative %: 58 % (ref 43.0–77.0)
Platelets: 227 10*3/uL (ref 150.0–400.0)
RBC: 4.29 Mil/uL (ref 3.87–5.11)
RDW: 13.3 % (ref 11.5–15.5)
WBC: 5.1 10*3/uL (ref 4.0–10.5)

## 2019-02-09 LAB — HEPATIC FUNCTION PANEL
ALT: 19 U/L (ref 0–35)
AST: 18 U/L (ref 0–37)
Albumin: 4.6 g/dL (ref 3.5–5.2)
Alkaline Phosphatase: 46 U/L (ref 39–117)
Bilirubin, Direct: 0.1 mg/dL (ref 0.0–0.3)
Total Bilirubin: 0.6 mg/dL (ref 0.2–1.2)
Total Protein: 6.9 g/dL (ref 6.0–8.3)

## 2019-02-09 LAB — BASIC METABOLIC PANEL
BUN: 17 mg/dL (ref 6–23)
CO2: 27 mEq/L (ref 19–32)
Calcium: 9.4 mg/dL (ref 8.4–10.5)
Chloride: 99 mEq/L (ref 96–112)
Creatinine, Ser: 0.98 mg/dL (ref 0.40–1.20)
GFR: 57.18 mL/min — ABNORMAL LOW (ref >60.00)
Glucose, Bld: 83 mg/dL (ref 70–99)
Potassium: 4.1 mEq/L (ref 3.5–5.1)
Sodium: 135 mEq/L (ref 135–145)

## 2019-02-09 LAB — VITAMIN D 25 HYDROXY (VIT D DEFICIENCY, FRACTURES): VITD: 57.54 ng/mL (ref 30.00–100.00)

## 2019-02-09 LAB — LIPID PANEL
Cholesterol: 156 mg/dL (ref 0–200)
HDL: 73.3 mg/dL (ref >39.00)
LDL Cholesterol: 66 mg/dL (ref 0–99)
NonHDL: 82.43
Total CHOL/HDL Ratio: 2
Triglycerides: 81 mg/dL (ref 0.0–149.0)
VLDL: 16.2 mg/dL (ref 0.0–40.0)

## 2019-02-09 LAB — TSH: TSH: 3.69 u[IU]/mL (ref 0.35–4.50)

## 2019-02-09 NOTE — Patient Instructions (Signed)
Follow up in 6 months to recheck BP and cholesterol We'll notify you of your lab results and make any changes if needed Continue to work on healthy diet and regular exercise- you can do it!! Please complete the cologuard and return it as directed Call with any questions or concerns Stay Safe!  Stay Healthy!

## 2019-02-09 NOTE — Assessment & Plan Note (Signed)
Pt has hx of this.  Check labs and replete prn. 

## 2019-02-09 NOTE — Progress Notes (Signed)
   Subjective:    Patient ID: Audrey Glover, female    DOB: 1955-04-18, 64 y.o.   MRN: [id number]  HPI CPE- UTD on Tdap, flu, mammo, pap.  Did not complete Cologuard and has not scheduled colonoscopy.   Review of Systems Patient reports no vision/ hearing changes, adenopathy,fever, weight change,  persistant/recurrent hoarseness , swallowing issues, chest pain, edema, persistant/recurrent cough, hemoptysis, dyspnea (rest/exertional/paroxysmal nocturnal), gastrointestinal bleeding (melena, rectal bleeding), abdominal pain, significant heartburn, bowel changes, GU symptoms (dysuria, hematuria, incontinence), Gyn symptoms (abnormal  bleeding, pain),  syncope, focal weakness, memory loss, numbness & tingling, skin/hair/nail changes, abnormal bruising or bleeding, anxiety, or depression.   + intermittent palpitations- tends to occur w/ increased carb intake  This visit occurred during the SARS-CoV-2 public health emergency.  Safety protocols were in place, including screening questions prior to the visit, additional usage of staff PPE, and extensive cleaning of exam room while observing appropriate contact time as indicated for disinfecting solutions.       Objective:   Physical Exam General Appearance:    Alert, cooperative, no distress, appears stated age  Head:    Normocephalic, without obvious abnormality, atraumatic  Eyes:    PERRL, conjunctiva/corneas clear, EOM's intact, fundi    benign, both eyes  Ears:    Normal TM's and external ear canals, both ears  Nose:   Deferred due to COVID  Throat:   Neck:   Supple, symmetrical, trachea midline, no adenopathy;    Thyroid: no enlargement/tenderness/nodules  Back:     Symmetric, no curvature, ROM normal, no CVA tenderness  Lungs:     Clear to auscultation bilaterally, respirations unlabored  Chest Wall:    No tenderness or deformity   Heart:    Regular rate and rhythm, S1 and S2 normal, no murmur, rub   or gallop  Breast Exam:     Deferred to GYN  Abdomen:     Soft, non-tender, bowel sounds active all four quadrants,    no masses, no organomegaly  Genitalia:    Deferred to GYN  Rectal:    Extremities:   Extremities normal, atraumatic, no cyanosis or edema  Pulses:   2+ and symmetric all extremities  Skin:   Skin color, texture, turgor normal, no rashes or lesions  Lymph nodes:   Cervical, supraclavicular, and axillary nodes normal  Neurologic:   CNII-XII intact, normal strength, sensation and reflexes    throughout          Assessment & Plan:

## 2019-02-09 NOTE — Assessment & Plan Note (Signed)
Pt's PE WNL.  UTD on pap, mammo, immunizations.  Pt to complete cologuard.  Check labs.  Anticipatory guidance provided.

## 2019-02-09 NOTE — Assessment & Plan Note (Signed)
Well controlled.  Asymptomatic.  Check labs.  No anticipated med changes.  Will follow.

## 2019-02-16 ENCOUNTER — Encounter: Payer: 59 | Admitting: Family Medicine

## 2019-04-23 ENCOUNTER — Other Ambulatory Visit: Payer: Self-pay | Admitting: Family Medicine

## 2019-08-08 ENCOUNTER — Encounter: Payer: Self-pay | Admitting: Family Medicine

## 2019-08-08 ENCOUNTER — Other Ambulatory Visit: Payer: Self-pay

## 2019-08-08 ENCOUNTER — Ambulatory Visit: Payer: 59 | Admitting: Family Medicine

## 2019-08-08 ENCOUNTER — Encounter: Payer: Self-pay | Admitting: General Practice

## 2019-08-08 VITALS — BP 122/80 | HR 79 | Temp 98.2°F | Resp 16 | Ht 65.0 in | Wt 154.4 lb

## 2019-08-08 DIAGNOSIS — I10 Essential (primary) hypertension: Secondary | ICD-10-CM

## 2019-08-08 DIAGNOSIS — Z1211 Encounter for screening for malignant neoplasm of colon: Secondary | ICD-10-CM | POA: Diagnosis not present

## 2019-08-08 DIAGNOSIS — E785 Hyperlipidemia, unspecified: Secondary | ICD-10-CM | POA: Diagnosis not present

## 2019-08-08 LAB — CBC WITH DIFFERENTIAL/PLATELET
Basophils Absolute: 0 10*3/uL (ref 0.0–0.1)
Basophils Relative: 0.8 % (ref 0.0–3.0)
Eosinophils Absolute: 0.1 10*3/uL (ref 0.0–0.7)
Eosinophils Relative: 2 % (ref 0.0–5.0)
HCT: 39.9 % (ref 36.0–46.0)
Hemoglobin: 13.6 g/dL (ref 12.0–15.0)
Lymphocytes Relative: 30.9 % (ref 12.0–46.0)
Lymphs Abs: 1.6 10*3/uL (ref 0.7–4.0)
MCHC: 34 g/dL (ref 30.0–36.0)
MCV: 93 fl (ref 78.0–100.0)
Monocytes Absolute: 0.5 10*3/uL (ref 0.1–1.0)
Monocytes Relative: 9.5 % (ref 3.0–12.0)
Neutro Abs: 2.9 10*3/uL (ref 1.4–7.7)
Neutrophils Relative %: 56.8 % (ref 43.0–77.0)
Platelets: 221 10*3/uL (ref 150.0–400.0)
RBC: 4.3 Mil/uL (ref 3.87–5.11)
RDW: 13.2 % (ref 11.5–15.5)
WBC: 5 10*3/uL (ref 4.0–10.5)

## 2019-08-08 LAB — HEPATIC FUNCTION PANEL
ALT: 17 U/L (ref 0–35)
AST: 17 U/L (ref 0–37)
Albumin: 4.6 g/dL (ref 3.5–5.2)
Alkaline Phosphatase: 48 U/L (ref 39–117)
Bilirubin, Direct: 0.1 mg/dL (ref 0.0–0.3)
Total Bilirubin: 0.5 mg/dL (ref 0.2–1.2)
Total Protein: 6.9 g/dL (ref 6.0–8.3)

## 2019-08-08 LAB — BASIC METABOLIC PANEL
BUN: 14 mg/dL (ref 6–23)
CO2: 30 mEq/L (ref 19–32)
Calcium: 9.9 mg/dL (ref 8.4–10.5)
Chloride: 102 mEq/L (ref 96–112)
Creatinine, Ser: 1.02 mg/dL (ref 0.40–1.20)
GFR: 54.51 mL/min — ABNORMAL LOW (ref >60.00)
Glucose, Bld: 90 mg/dL (ref 70–99)
Potassium: 4.6 mEq/L (ref 3.5–5.1)
Sodium: 138 mEq/L (ref 135–145)

## 2019-08-08 LAB — LIPID PANEL
Cholesterol: 147 mg/dL (ref 0–200)
HDL: 71.7 mg/dL (ref >39.00)
LDL Cholesterol: 60 mg/dL (ref 0–99)
NonHDL: 75.39
Total CHOL/HDL Ratio: 2
Triglycerides: 75 mg/dL (ref 0.0–149.0)
VLDL: 15 mg/dL (ref 0.0–40.0)

## 2019-08-08 LAB — TSH: TSH: 2.92 u[IU]/mL (ref 0.35–4.50)

## 2019-08-08 NOTE — Patient Instructions (Addendum)
Schedule your complete physical in 6 months We'll notify you of your lab results and make any changes if needed Complete and return the Cologuard as directed Keep up the good work on healthy diet and regular exercise- you're doing great! Call with any questions or concerns Have a great summer!!!

## 2019-08-08 NOTE — Progress Notes (Signed)
   Subjective:    Patient ID: Audrey Glover, female    DOB: 06-17-1955, 64 y.o.   MRN: [id number]  HPI HTN- chronic problem, on Lisinopril 10mg  daily w/ good control.  No CP, SOB, HAs, visual changes, edema.  Hyperlipidemia- chronic problem, on Crestor 10mg  daily.  No abd pain, N/V.  Colon cancer screen- pt is open to cologuard rather than colonoscopy.  Was ordered January 2020 but had expired by the time she sent her sample.  Will reorder today.    UTD on COVID vaccine- Pfizer   Review of Systems For ROS see HPI   This visit occurred during the SARS-CoV-2 public health emergency.  Safety protocols were in place, including screening questions prior to the visit, additional usage of staff PPE, and extensive cleaning of exam room while observing appropriate contact time as indicated for disinfecting solutions.       Objective:   Physical Exam Vitals reviewed.  Constitutional:      General: She is not in acute distress.    Appearance: Normal appearance. She is well-developed.  HENT:     Head: Normocephalic and atraumatic.  Eyes:     Conjunctiva/sclera: Conjunctivae normal.     Pupils: Pupils are equal, round, and reactive to light.  Neck:     Thyroid: No thyromegaly.  Cardiovascular:     Rate and Rhythm: Normal rate and regular rhythm.     Heart sounds: Normal heart sounds. No murmur heard.   Pulmonary:     Effort: Pulmonary effort is normal. No respiratory distress.     Breath sounds: Normal breath sounds.  Abdominal:     General: There is no distension.     Palpations: Abdomen is soft.     Tenderness: There is no abdominal tenderness.  Musculoskeletal:     Cervical back: Normal range of motion and neck supple.  Lymphadenopathy:     Cervical: No cervical adenopathy.  Skin:    General: Skin is warm and dry.  Neurological:     Mental Status: She is alert and oriented to person, place, and time.  Psychiatric:        Behavior: Behavior normal.             Assessment & Plan:  Colon cancer screen- pt is open to Cologuard rather than colonoscopy.  Order placed today.

## 2019-08-08 NOTE — Assessment & Plan Note (Signed)
Chronic problem.  Well controlled today.  Asymptomatic.  Check labs.  No anticipated med changes.  Will follow. 

## 2019-08-08 NOTE — Assessment & Plan Note (Signed)
Chronic problem, tolerating statin w/o difficulty.  Check labs.  Adjust meds prn  

## 2019-08-21 ENCOUNTER — Other Ambulatory Visit: Payer: Self-pay | Admitting: Family Medicine

## 2019-08-21 DIAGNOSIS — Z1231 Encounter for screening mammogram for malignant neoplasm of breast: Secondary | ICD-10-CM

## 2019-09-18 ENCOUNTER — Other Ambulatory Visit: Payer: Self-pay

## 2019-09-18 ENCOUNTER — Ambulatory Visit
Admission: RE | Admit: 2019-09-18 | Discharge: 2019-09-18 | Disposition: A | Discharge status: Home / Self Care | Payer: 59 | Source: Ambulatory Visit | Attending: Family Medicine | Admitting: Family Medicine

## 2019-09-18 DIAGNOSIS — Z1231 Encounter for screening mammogram for malignant neoplasm of breast: Secondary | ICD-10-CM

## 2019-09-20 ENCOUNTER — Other Ambulatory Visit: Payer: Self-pay | Admitting: Family Medicine

## 2019-09-20 DIAGNOSIS — R928 Other abnormal and inconclusive findings on diagnostic imaging of breast: Secondary | ICD-10-CM

## 2019-10-02 ENCOUNTER — Other Ambulatory Visit: Payer: Self-pay | Admitting: Family Medicine

## 2019-10-02 ENCOUNTER — Other Ambulatory Visit: Payer: Self-pay

## 2019-10-02 ENCOUNTER — Ambulatory Visit
Admission: RE | Admit: 2019-10-02 | Discharge: 2019-10-02 | Disposition: A | Discharge status: Home / Self Care | Payer: 59 | Source: Ambulatory Visit | Attending: Family Medicine | Admitting: Family Medicine

## 2019-10-02 DIAGNOSIS — N6310 Unspecified lump in the right breast, unspecified quadrant: Secondary | ICD-10-CM

## 2019-10-02 DIAGNOSIS — R928 Other abnormal and inconclusive findings on diagnostic imaging of breast: Secondary | ICD-10-CM

## 2019-10-08 ENCOUNTER — Other Ambulatory Visit: Payer: Self-pay

## 2019-10-08 ENCOUNTER — Ambulatory Visit
Admission: RE | Admit: 2019-10-08 | Discharge: 2019-10-08 | Disposition: A | Discharge status: Home / Self Care | Payer: 59 | Source: Ambulatory Visit | Attending: Family Medicine | Admitting: Family Medicine

## 2019-10-08 DIAGNOSIS — N6310 Unspecified lump in the right breast, unspecified quadrant: Secondary | ICD-10-CM

## 2019-10-18 ENCOUNTER — Other Ambulatory Visit: Payer: Self-pay | Admitting: Family Medicine

## 2019-11-04 ENCOUNTER — Other Ambulatory Visit: Payer: Self-pay | Admitting: Family Medicine

## 2019-11-06 LAB — COLOGUARD
COLOGUARD: NEGATIVE
Cologuard: NEGATIVE

## 2019-11-07 ENCOUNTER — Encounter: Payer: Self-pay | Admitting: General Practice

## 2019-11-07 ENCOUNTER — Encounter: Payer: Self-pay | Admitting: Family Medicine

## 2019-11-09 ENCOUNTER — Encounter: Payer: Self-pay | Admitting: Family Medicine

## 2020-02-08 ENCOUNTER — Ambulatory Visit (INDEPENDENT_AMBULATORY_CARE_PROVIDER_SITE_OTHER): Payer: 59 | Admitting: Family Medicine

## 2020-02-08 ENCOUNTER — Other Ambulatory Visit: Payer: Self-pay

## 2020-02-08 ENCOUNTER — Encounter: Payer: Self-pay | Admitting: Family Medicine

## 2020-02-08 VITALS — BP 118/80 | HR 77 | Temp 97.5°F | Resp 18 | Ht 65.0 in | Wt 155.8 lb

## 2020-02-08 DIAGNOSIS — E559 Vitamin D deficiency, unspecified: Secondary | ICD-10-CM

## 2020-02-08 DIAGNOSIS — E785 Hyperlipidemia, unspecified: Secondary | ICD-10-CM | POA: Diagnosis not present

## 2020-02-08 DIAGNOSIS — Z0000 Encounter for general adult medical examination without abnormal findings: Secondary | ICD-10-CM

## 2020-02-08 LAB — LIPID PANEL
Cholesterol: 290 mg/dL — ABNORMAL HIGH (ref 0–200)
HDL: 67.4 mg/dL (ref >39.00)
LDL Cholesterol: 209 mg/dL — ABNORMAL HIGH (ref 0–99)
NonHDL: 222.97
Total CHOL/HDL Ratio: 4
Triglycerides: 70 mg/dL (ref 0.0–149.0)
VLDL: 14 mg/dL (ref 0.0–40.0)

## 2020-02-08 LAB — CBC WITH DIFFERENTIAL/PLATELET
Basophils Absolute: 0 10*3/uL (ref 0.0–0.1)
Basophils Relative: 0.9 % (ref 0.0–3.0)
Eosinophils Absolute: 0.1 10*3/uL (ref 0.0–0.7)
Eosinophils Relative: 1.7 % (ref 0.0–5.0)
HCT: 42.1 % (ref 36.0–46.0)
Hemoglobin: 14.4 g/dL (ref 12.0–15.0)
Lymphocytes Relative: 31.6 % (ref 12.0–46.0)
Lymphs Abs: 1.6 10*3/uL (ref 0.7–4.0)
MCHC: 34.3 g/dL (ref 30.0–36.0)
MCV: 91.7 fl (ref 78.0–100.0)
Monocytes Absolute: 0.5 10*3/uL (ref 0.1–1.0)
Monocytes Relative: 10.6 % (ref 3.0–12.0)
Neutro Abs: 2.7 10*3/uL (ref 1.4–7.7)
Neutrophils Relative %: 55.2 % (ref 43.0–77.0)
Platelets: 235 10*3/uL (ref 150.0–400.0)
RBC: 4.59 Mil/uL (ref 3.87–5.11)
RDW: 13.2 % (ref 11.5–15.5)
WBC: 5 10*3/uL (ref 4.0–10.5)

## 2020-02-08 LAB — BASIC METABOLIC PANEL
BUN: 21 mg/dL (ref 6–23)
CO2: 28 mEq/L (ref 19–32)
Calcium: 10 mg/dL (ref 8.4–10.5)
Chloride: 101 mEq/L (ref 96–112)
Creatinine, Ser: 1.04 mg/dL (ref 0.40–1.20)
GFR: 56.7 mL/min — ABNORMAL LOW (ref >60.00)
Glucose, Bld: 86 mg/dL (ref 70–99)
Potassium: 5.1 mEq/L (ref 3.5–5.1)
Sodium: 135 mEq/L (ref 135–145)

## 2020-02-08 LAB — HEPATIC FUNCTION PANEL
ALT: 12 U/L (ref 0–35)
AST: 15 U/L (ref 0–37)
Albumin: 4.6 g/dL (ref 3.5–5.2)
Alkaline Phosphatase: 45 U/L (ref 39–117)
Bilirubin, Direct: 0.1 mg/dL (ref 0.0–0.3)
Total Bilirubin: 0.7 mg/dL (ref 0.2–1.2)
Total Protein: 7.4 g/dL (ref 6.0–8.3)

## 2020-02-08 LAB — TSH: TSH: 4.32 u[IU]/mL (ref 0.35–4.50)

## 2020-02-08 LAB — VITAMIN D 25 HYDROXY (VIT D DEFICIENCY, FRACTURES): VITD: 52.32 ng/mL (ref 30.00–100.00)

## 2020-02-08 MED ORDER — LISINOPRIL 10 MG PO TABS
10.0000 mg | ORAL_TABLET | Freq: Every day | ORAL | 1 refills | Status: DC
Start: 2020-02-08 — End: 2020-04-14

## 2020-02-08 NOTE — Assessment & Plan Note (Signed)
Pt has hx of similar.  Check labs and replete prn. °

## 2020-02-08 NOTE — Assessment & Plan Note (Signed)
Chronic problem.  Pt again has statin intolerance.  Check labs.  And if lipids are again high will start Zetia.

## 2020-02-08 NOTE — Progress Notes (Signed)
   Subjective:    Patient ID: Audrey Glover, female    DOB: 1955-03-03, 65 y.o.   MRN: [id number]  HPI CPE- UTD on Cologuard, mammo.  Due for pap but is turning 4 in April  Reviewed past medical, surgical, family and social histories.   Patient Care Team    Relationship Specialty Notifications Start End  Midge Minium, MD PCP - General Family Medicine  05/29/13   Princess Bruins, MD Consulting Physician Obstetrics and Gynecology  12/30/14     Health Maintenance  Topic Date Due  . PAP SMEAR-Modifier  02/08/2020 (Originally 04/21/2019)  . Hepatitis C Screening  02/09/2020 (Originally [birth year])  . HIV Screening  02/09/2020 (Originally 05/05/1970)  . MAMMOGRAM  10/01/2020  . TETANUS/TDAP  10/05/2022  . Fecal DNA (Cologuard)  10/28/2022  . INFLUENZA VACCINE  Completed  . COVID-19 Vaccine  Completed      Review of Systems Patient reports no vision/ hearing changes, adenopathy,fever, weight change,  persistant/recurrent hoarseness , swallowing issues, chest pain, palpitations, edema, persistant/recurrent cough, hemoptysis, dyspnea (rest/exertional/paroxysmal nocturnal), gastrointestinal bleeding (melena, rectal bleeding), abdominal pain, significant heartburn, bowel changes, GU symptoms (dysuria, hematuria, incontinence), Gyn symptoms (abnormal  bleeding, pain),  syncope, focal weakness, memory loss, numbness & tingling, skin/hair/nail changes, abnormal bruising or bleeding, anxiety, or depression.   Stopped statin due to hot flashes, previously lipitor caused 'extreme constipation', red yeast rice caused insomnia and memory issues.  This visit occurred during the SARS-CoV-2 public health emergency.  Safety protocols were in place, including screening questions prior to the visit, additional usage of staff PPE, and extensive cleaning of exam room while observing appropriate contact time as indicated for disinfecting solutions.       Objective:   Physical Exam General  Appearance:    Alert, cooperative, no distress, appears stated age  Head:    Normocephalic, without obvious abnormality, atraumatic  Eyes:    PERRL, conjunctiva/corneas clear, EOM's intact, fundi    benign, both eyes  Ears:    Normal TM's and external ear canals, both ears  Nose:   Deferred due to COVID  Throat:   Neck:   Supple, symmetrical, trachea midline, no adenopathy;    Thyroid: no enlargement/tenderness/nodules  Back:     Symmetric, no curvature, ROM normal, no CVA tenderness  Lungs:     Clear to auscultation bilaterally, respirations unlabored  Chest Wall:    No tenderness or deformity   Heart:    Regular rate and rhythm, S1 and S2 normal, no murmur, rub   or gallop  Breast Exam:    Deferred to GYN  Abdomen:     Soft, non-tender, bowel sounds active all four quadrants,    no masses, no organomegaly  Genitalia:    Deferred to GYN  Rectal:    Extremities:   Extremities normal, atraumatic, no cyanosis or edema  Pulses:   2+ and symmetric all extremities  Skin:   Skin color, texture, turgor normal, no rashes or lesions  Lymph nodes:   Cervical, supraclavicular, and axillary nodes normal  Neurologic:   CNII-XII intact, normal strength, sensation and reflexes    throughout          Assessment & Plan:

## 2020-02-08 NOTE — Assessment & Plan Note (Signed)
Pt's PE WNL.  UTD on cologuard, mammogram.  Is deciding whether she wants to do 1 last pap before age 65.  UTD on vaccines.  Check labs.  Anticipatory guidance provided.

## 2020-02-08 NOTE — Patient Instructions (Addendum)
Follow up in 6 months to recheck BP and cholesterol We'll notify you of your lab results and make any changes if needed Continue to work on healthy diet and regular exercise- you can do it! Call with any questions or concerns Stay Safe!  Stay Healthy! Happy Spring! 

## 2020-02-11 ENCOUNTER — Other Ambulatory Visit: Payer: Self-pay | Admitting: *Deleted

## 2020-02-11 MED ORDER — EZETIMIBE 10 MG PO TABS: 10.0000 mg | ORAL_TABLET | Freq: Every day | ORAL | 3 refills | Status: DC

## 2020-02-11 MED ORDER — ALBUTEROL SULFATE HFA 108 (90 BASE) MCG/ACT IN AERS: 2.0000 | INHALATION_SPRAY | Freq: Four times a day (QID) | RESPIRATORY_TRACT | 0 refills | Status: DC | PRN

## 2020-02-11 MED ORDER — FLUTICASONE PROPIONATE 50 MCG/ACT NA SUSP: NASAL | 0 refills | Status: DC

## 2020-03-09 ENCOUNTER — Other Ambulatory Visit: Payer: Self-pay | Admitting: Family Medicine

## 2020-03-24 ENCOUNTER — Other Ambulatory Visit: Payer: Self-pay | Admitting: Family Medicine

## 2020-04-02 ENCOUNTER — Other Ambulatory Visit: Payer: Self-pay | Admitting: Family Medicine

## 2020-04-02 DIAGNOSIS — Z09 Encounter for follow-up examination after completed treatment for conditions other than malignant neoplasm: Secondary | ICD-10-CM

## 2020-04-08 ENCOUNTER — Encounter: Payer: Self-pay | Admitting: Family Medicine

## 2020-04-14 ENCOUNTER — Other Ambulatory Visit: Payer: Self-pay | Admitting: Family Medicine

## 2020-05-13 ENCOUNTER — Other Ambulatory Visit: Payer: Self-pay | Admitting: Family Medicine

## 2020-05-15 ENCOUNTER — Ambulatory Visit: Payer: 59

## 2020-05-15 ENCOUNTER — Ambulatory Visit
Admission: RE | Admit: 2020-05-15 | Discharge: 2020-05-15 | Disposition: A | Discharge status: Home / Self Care | Payer: 59 | Source: Ambulatory Visit | Attending: Family Medicine | Admitting: Family Medicine

## 2020-05-15 ENCOUNTER — Other Ambulatory Visit: Payer: Self-pay

## 2020-05-15 DIAGNOSIS — Z09 Encounter for follow-up examination after completed treatment for conditions other than malignant neoplasm: Secondary | ICD-10-CM

## 2020-07-09 ENCOUNTER — Encounter: Payer: Self-pay | Admitting: *Deleted

## 2020-08-07 ENCOUNTER — Ambulatory Visit: Payer: 59 | Admitting: Family Medicine

## 2020-08-13 ENCOUNTER — Ambulatory Visit: Payer: 59 | Admitting: Family Medicine

## 2020-11-05 ENCOUNTER — Ambulatory Visit: Payer: 59 | Admitting: Family Medicine

## 2020-11-10 ENCOUNTER — Other Ambulatory Visit: Payer: Self-pay | Admitting: Family Medicine

## 2020-11-26 ENCOUNTER — Other Ambulatory Visit: Payer: Self-pay | Admitting: Family Medicine

## 2020-11-26 DIAGNOSIS — Z1231 Encounter for screening mammogram for malignant neoplasm of breast: Secondary | ICD-10-CM

## 2020-11-30 ENCOUNTER — Other Ambulatory Visit: Payer: Self-pay | Admitting: Family Medicine

## 2020-12-02 ENCOUNTER — Ambulatory Visit: Payer: 59 | Admitting: Family Medicine

## 2021-01-13 ENCOUNTER — Ambulatory Visit
Admission: RE | Admit: 2021-01-13 | Discharge: 2021-01-13 | Disposition: A | Discharge status: Home / Self Care | Payer: 59 | Source: Ambulatory Visit | Attending: Family Medicine | Admitting: Family Medicine

## 2021-01-13 DIAGNOSIS — Z1231 Encounter for screening mammogram for malignant neoplasm of breast: Secondary | ICD-10-CM

## 2021-01-20 ENCOUNTER — Encounter: Payer: Self-pay | Admitting: Family Medicine

## 2021-01-20 ENCOUNTER — Ambulatory Visit: Payer: 59 | Admitting: Family Medicine

## 2021-01-20 VITALS — BP 130/82 | HR 73 | Temp 97.7°F | Resp 16 | Wt 159.2 lb

## 2021-01-20 DIAGNOSIS — E559 Vitamin D deficiency, unspecified: Secondary | ICD-10-CM | POA: Diagnosis not present

## 2021-01-20 DIAGNOSIS — E663 Overweight: Secondary | ICD-10-CM | POA: Diagnosis not present

## 2021-01-20 DIAGNOSIS — E785 Hyperlipidemia, unspecified: Secondary | ICD-10-CM | POA: Diagnosis not present

## 2021-01-20 DIAGNOSIS — Z114 Encounter for screening for human immunodeficiency virus [HIV]: Secondary | ICD-10-CM

## 2021-01-20 DIAGNOSIS — Z1159 Encounter for screening for other viral diseases: Secondary | ICD-10-CM

## 2021-01-20 DIAGNOSIS — I10 Essential (primary) hypertension: Secondary | ICD-10-CM | POA: Diagnosis not present

## 2021-01-20 DIAGNOSIS — Z780 Asymptomatic menopausal state: Secondary | ICD-10-CM

## 2021-01-20 LAB — CBC WITH DIFFERENTIAL/PLATELET
Basophils Absolute: 0 10*3/uL (ref 0.0–0.1)
Basophils Relative: 1.1 % (ref 0.0–3.0)
Eosinophils Absolute: 0.1 10*3/uL (ref 0.0–0.7)
Eosinophils Relative: 1.7 % (ref 0.0–5.0)
HCT: 41.1 % (ref 36.0–46.0)
Hemoglobin: 13.8 g/dL (ref 12.0–15.0)
Lymphocytes Relative: 35 % (ref 12.0–46.0)
Lymphs Abs: 1.5 10*3/uL (ref 0.7–4.0)
MCHC: 33.6 g/dL (ref 30.0–36.0)
MCV: 93.1 fl (ref 78.0–100.0)
Monocytes Absolute: 0.4 10*3/uL (ref 0.1–1.0)
Monocytes Relative: 10 % (ref 3.0–12.0)
Neutro Abs: 2.2 10*3/uL (ref 1.4–7.7)
Neutrophils Relative %: 52.2 % (ref 43.0–77.0)
Platelets: 225 10*3/uL (ref 150.0–400.0)
RBC: 4.42 Mil/uL (ref 3.87–5.11)
RDW: 13.4 % (ref 11.5–15.5)
WBC: 4.3 10*3/uL (ref 4.0–10.5)

## 2021-01-20 LAB — LIPID PANEL
Cholesterol: 225 mg/dL — ABNORMAL HIGH (ref 0–200)
HDL: 57.8 mg/dL (ref >39.00)
LDL Cholesterol: 152 mg/dL — ABNORMAL HIGH (ref 0–99)
NonHDL: 166.76
Total CHOL/HDL Ratio: 4
Triglycerides: 72 mg/dL (ref 0.0–149.0)
VLDL: 14.4 mg/dL (ref 0.0–40.0)

## 2021-01-20 LAB — BASIC METABOLIC PANEL
BUN: 24 mg/dL — ABNORMAL HIGH (ref 6–23)
CO2: 27 mEq/L (ref 19–32)
Calcium: 9.3 mg/dL (ref 8.4–10.5)
Chloride: 103 mEq/L (ref 96–112)
Creatinine, Ser: 1.06 mg/dL (ref 0.40–1.20)
GFR: 55.05 mL/min — ABNORMAL LOW (ref >60.00)
Glucose, Bld: 79 mg/dL (ref 70–99)
Potassium: 4.1 mEq/L (ref 3.5–5.1)
Sodium: 138 mEq/L (ref 135–145)

## 2021-01-20 LAB — TSH: TSH: 6.15 u[IU]/mL — ABNORMAL HIGH (ref 0.35–5.50)

## 2021-01-20 LAB — VITAMIN D 25 HYDROXY (VIT D DEFICIENCY, FRACTURES): VITD: 65.34 ng/mL (ref 30.00–100.00)

## 2021-01-20 NOTE — Assessment & Plan Note (Signed)
Pt has gained 5 lbs since last visit.  BMI now 26.49  No regular exercise but is eating a Mediterranean diet.  Will continue to follow.

## 2021-01-20 NOTE — Assessment & Plan Note (Signed)
Chronic problem.  On Zetia w/o difficulty.  Check labs.  Adjust meds prn  

## 2021-01-20 NOTE — Assessment & Plan Note (Signed)
Chronic problem.  Well controlled on Lisinopril 10mg  daily.  Currently asymptomatic.  Check labs due to ACE but no expected med changes.

## 2021-01-20 NOTE — Progress Notes (Signed)
° °  Subjective:    Patient ID: Audrey Glover, female    DOB: 03-26-1955, 66 y.o.   MRN: [id number]  HPI HTN- chronic problem, on Lisinopril 10mg  daily w/ adequate control.  Pt reports home BPs are even lower.  No CP, SOB, HAs, visual changes, edema.  Hyperlipidemia- chronic problem, on Zetia 10mg  daily.  No abd pain, N/V.  Over weight- pt has gained 5 lbs since last visit.  BMI is 26.49  no regular exercise.  Is attempting to eat a Mediterranean diet.  Vit D deficiency- pt is due for repeat labs and has never had a bone density  Review of Systems For ROS see HPI   This visit occurred during the SARS-CoV-2 public health emergency.  Safety protocols were in place, including screening questions prior to the visit, additional usage of staff PPE, and extensive cleaning of exam room while observing appropriate contact time as indicated for disinfecting solutions.      Objective:   Physical Exam Vitals reviewed.  Constitutional:      General: She is not in acute distress.    Appearance: Normal appearance. She is well-developed. She is not ill-appearing.  HENT:     Head: Normocephalic and atraumatic.  Eyes:     Conjunctiva/sclera: Conjunctivae normal.     Pupils: Pupils are equal, round, and reactive to light.  Neck:     Thyroid: No thyromegaly.  Cardiovascular:     Rate and Rhythm: Normal rate and regular rhythm.     Pulses: Normal pulses.     Heart sounds: Normal heart sounds. No murmur heard. Pulmonary:     Effort: Pulmonary effort is normal. No respiratory distress.     Breath sounds: Normal breath sounds.  Abdominal:     General: There is no distension.     Palpations: Abdomen is soft.     Tenderness: There is no abdominal tenderness.  Musculoskeletal:     Cervical back: Normal range of motion and neck supple.     Right lower leg: No edema.     Left lower leg: No edema.  Lymphadenopathy:     Cervical: No cervical adenopathy.  Skin:    General: Skin is warm and dry.   Neurological:     General: No focal deficit present.     Mental Status: She is alert and oriented to person, place, and time.  Psychiatric:        Behavior: Behavior normal.          Assessment & Plan:

## 2021-01-20 NOTE — Patient Instructions (Addendum)
Schedule your complete physical in 6 months We'll notify you of your lab results and make any changes if needed Continue to work on healthy diet and regular exercise- you're doing great! We'll call you to set up your bone density appt Call with any questions or concerns Stay Safe!  Stay Healthy! Happy New Year!!

## 2021-01-20 NOTE — Assessment & Plan Note (Signed)
Check labs and replete prn.  Check DEXA to assess for osteopenia/porosis

## 2021-01-21 ENCOUNTER — Telehealth: Payer: Self-pay

## 2021-01-21 ENCOUNTER — Other Ambulatory Visit: Payer: Self-pay

## 2021-01-21 DIAGNOSIS — R7989 Other specified abnormal findings of blood chemistry: Secondary | ICD-10-CM

## 2021-01-21 LAB — HIV ANTIBODY (ROUTINE TESTING W REFLEX): HIV 1&2 Ab, 4th Generation: NONREACTIVE

## 2021-01-21 LAB — HEPATITIS C ANTIBODY
Hepatitis C Ab: NONREACTIVE
SIGNAL TO CUT-OFF: 0.09

## 2021-01-21 MED ORDER — LEVOTHYROXINE SODIUM 50 MCG PO TABS: 50.0000 ug | ORAL_TABLET | Freq: Every day | ORAL | 3 refills | Status: DC

## 2021-01-21 NOTE — Telephone Encounter (Signed)
Pt is aware of labs and medication. Med sent to pharmacy, tsh lab visit scheduled and future ordered.

## 2021-01-21 NOTE — Telephone Encounter (Signed)
-----   Message from Midge Minium, MD sent at 01/20/2021  8:06 PM EST ----- Total cholesterol and LDL are both improved since last visit.  This is great news!  Continue the Zetia and fish oil daily  TSH is mildly elevated- meaning your thyroid is under functioning.  This can cause fatigue, weight gain, dry skin/hair/nails, hair loss, etc.  Based on this, we're going to start Levothyroxine 76mcg daily, #30, 3 refills and we'll repeat your TSH level at a lab only visit in 1 month.  Remainder of labs look great!

## 2021-01-23 ENCOUNTER — Encounter: Payer: Self-pay | Admitting: Family Medicine

## 2021-02-20 ENCOUNTER — Other Ambulatory Visit: Payer: 59

## 2021-02-26 ENCOUNTER — Other Ambulatory Visit: Payer: 59

## 2021-02-27 ENCOUNTER — Other Ambulatory Visit (INDEPENDENT_AMBULATORY_CARE_PROVIDER_SITE_OTHER): Payer: 59

## 2021-02-27 DIAGNOSIS — R7989 Other specified abnormal findings of blood chemistry: Secondary | ICD-10-CM

## 2021-02-27 LAB — TSH: TSH: 1.21 u[IU]/mL (ref 0.35–5.50)

## 2021-03-02 ENCOUNTER — Telehealth: Payer: Self-pay

## 2021-03-02 NOTE — Telephone Encounter (Signed)
-----   Message from Midge Minium, MD sent at 03/02/2021  7:24 AM EST ----- TSH now normal.  No med changes at this time

## 2021-03-02 NOTE — Telephone Encounter (Signed)
Patient aware of labs.  

## 2021-04-02 ENCOUNTER — Other Ambulatory Visit: Payer: Self-pay | Admitting: Family Medicine

## 2021-04-02 DIAGNOSIS — R7989 Other specified abnormal findings of blood chemistry: Secondary | ICD-10-CM

## 2021-04-14 DIAGNOSIS — H2513 Age-related nuclear cataract, bilateral: Secondary | ICD-10-CM | POA: Insufficient documentation

## 2021-05-17 ENCOUNTER — Other Ambulatory Visit: Payer: Self-pay | Admitting: Family Medicine

## 2021-06-09 ENCOUNTER — Other Ambulatory Visit: Payer: Self-pay | Admitting: Family Medicine

## 2021-06-29 ENCOUNTER — Emergency Department (HOSPITAL_COMMUNITY): Payer: 59

## 2021-06-29 ENCOUNTER — Encounter (HOSPITAL_COMMUNITY): Payer: Self-pay

## 2021-06-29 ENCOUNTER — Emergency Department (HOSPITAL_COMMUNITY)
Admission: EM | Admit: 2021-06-29 | Discharge: 2021-06-30 | Disposition: A | Discharge status: Home / Self Care | Payer: 59 | Attending: Emergency Medicine | Admitting: Emergency Medicine

## 2021-06-29 DIAGNOSIS — Z79899 Other long term (current) drug therapy: Secondary | ICD-10-CM | POA: Insufficient documentation

## 2021-06-29 DIAGNOSIS — M79602 Pain in left arm: Secondary | ICD-10-CM | POA: Insufficient documentation

## 2021-06-29 DIAGNOSIS — I10 Essential (primary) hypertension: Secondary | ICD-10-CM | POA: Diagnosis not present

## 2021-06-29 LAB — TROPONIN I (HIGH SENSITIVITY)
Troponin I (High Sensitivity): 2 ng/L (ref <18)
Troponin I (High Sensitivity): 3 ng/L (ref <18)

## 2021-06-29 LAB — BASIC METABOLIC PANEL
Anion gap: 7 (ref 5–15)
BUN: 17 mg/dL (ref 8–23)
CO2: 25 mmol/L (ref 22–32)
Calcium: 9.1 mg/dL (ref 8.9–10.3)
Chloride: 108 mmol/L (ref 98–111)
Creatinine, Ser: 1.1 mg/dL — ABNORMAL HIGH (ref 0.44–1.00)
GFR, Estimated: 55 mL/min — ABNORMAL LOW (ref >60)
Glucose, Bld: 99 mg/dL (ref 70–99)
Potassium: 4.1 mmol/L (ref 3.5–5.1)
Sodium: 140 mmol/L (ref 135–145)

## 2021-06-29 LAB — CBC
HCT: 44.9 % (ref 36.0–46.0)
Hemoglobin: 15 g/dL (ref 12.0–15.0)
MCH: 31.1 pg (ref 26.0–34.0)
MCHC: 33.4 g/dL (ref 30.0–36.0)
MCV: 93.2 fL (ref 80.0–100.0)
Platelets: 247 10*3/uL (ref 150–400)
RBC: 4.82 MIL/uL (ref 3.87–5.11)
RDW: 13 % (ref 11.5–15.5)
WBC: 7.6 10*3/uL (ref 4.0–10.5)
nRBC: 0 % (ref 0.0–0.2)

## 2021-06-29 NOTE — ED Triage Notes (Signed)
Pt arrived via POV, c/o left arm pain, radiating up to shoulder. Denies any chest pain or SOB.

## 2021-06-29 NOTE — ED Provider Triage Note (Signed)
Emergency Medicine Provider Triage Evaluation Note  Audrey Glover , a 66 y.o. female  was evaluated in triage.  Pt complains of left shoulder and arm pain. States that same began around 2 pm today when she noticed the pain initially in her left antecubital fossa radiating down her arm as well as up into her shoulder.  She states that the pain has been persistent since then.  She denies any recent heavy lifting or increase in activity.  She is left-hand dominant.  Denies any chest pain, shortness of breath, or neck pain.  Denies any history of heart problems.  Does state that she takes 10 mg of lisinopril daily for high blood pressure and that she has been taking this as prescribed.  Review of Systems  Positive:  Negative:   Physical Exam  BP (!) 169/111 (BP Location: Right Arm)   Pulse 98   Temp 98.1 F (36.7 C) (Oral)   Resp 16   Ht '5\' 5"'$  (1.651 m)   Wt 73.6 kg   SpO2 96%   BMI 27.01 kg/m  Gen:   Awake, no distress   Resp:  Normal effort  MSK:   Moves extremities without difficulty  Other:  Unable to reproduce left arm pain to palpation, radial pulses intact and 2+.  No focal deficits  Medical Decision Making  Medically screening exam initiated at 4:25 PM.  Appropriate orders placed.  Audrey Glover was informed that the remainder of the evaluation will be completed by another provider, this initial triage assessment does not replace that evaluation, and the importance of remaining in the ED until their evaluation is complete.     Bud Face, PA-C 06/29/21 1628

## 2021-06-30 NOTE — ED Notes (Signed)
I provided reinforced discharge education based off of discharge instructions. Pt acknowledged and understood my education. Pt had no further questions/concerns for provider/myself.  °

## 2021-06-30 NOTE — ED Provider Notes (Signed)
[place] DEPT Provider Note   CSN: [id number] Arrival date & time: 06/29/21  1512     History  Chief Complaint  Patient presents with   Arm Pain    Audrey Glover is a 66 y.o. female.  HPI     This is a 66 year old female with a history of high blood pressure who presents with left arm pain.  Patient states that she had onset of left arm pain around 2 PM yesterday.  Pain started in the crease of her elbow and was mostly in her forearm.  She since has had pain in her upper arm and shoulder as well.  She states that the pain seems to migrate.  It does not radiate.  Does not seem to be worse with range of motion.  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  She does not have any chest pain, shortness of breath.  She is concerned because her blood pressure was noted to be elevated upon arrival.  She has been taking lisinopril at a stable dose of 10 mg for the last 10 years.  No headache.  No neck pain.  Home Medications Prior to Admission medications   Medication Sig Start Date End Date Taking? Authorizing Provider  albuterol (VENTOLIN HFA) 108 (90 Base) MCG/ACT inhaler Inhale 2 puffs into the lungs every 6 (six) hours as needed for wheezing or shortness of breath. 02/11/20   Midge Minium, MD  Coenzyme Q10 (CO Q 10 PO) Take by mouth.    [provider]  ezetimibe (ZETIA) 10 MG tablet TAKE 1 TABLET BY MOUTH EVERY DAY 05/18/21   Midge Minium, MD  fish oil-omega-3 fatty acids 1000 MG capsule Take 1 g by mouth every morning.    [provider]  levothyroxine (SYNTHROID) 50 MCG tablet TAKE 1 TABLET BY MOUTH EVERY DAY 04/02/21   Midge Minium, MD  lisinopril (ZESTRIL) 10 MG tablet TAKE 1 TABLET BY MOUTH EVERY DAY 06/09/21   Midge Minium, MD  Multiple Vitamins-Minerals (MULTIVITAMIN ADULT PO) Take by mouth.    [provider]  omeprazole  (PRILOSEC) 20 MG capsule     [provider]  Propylene Glycol (SYSTANE COMPLETE OP)     [provider]  Vitamin D, Cholecalciferol, 50 MCG (2000 UT) CAPS Take by mouth.    [provider]      Allergies    Statins and Sulfa antibiotics    Review of Systems   Review of Systems  Constitutional:  Negative for fever.  Respiratory:  Negative for shortness of breath.   Cardiovascular:  Negative for chest pain.  Musculoskeletal:        Arm pain  All other systems reviewed and are negative.   Physical Exam Updated Vital Signs BP (!) 174/114 (BP Location: Right Arm)   Pulse 75   Temp 98.1 F (36.7 C) (Oral)   Resp 16   Ht 1.651 m ('5\' 5"'$ )   Wt 73.6 kg   SpO2 95%   BMI 27.01 kg/m  Physical Exam Vitals and nursing note reviewed.  Constitutional:      Appearance: She is well-developed. She is not ill-appearing.  HENT:     Head: Normocephalic and atraumatic.  Eyes:     Pupils: Pupils are equal, round, and reactive to light.  Cardiovascular:     Rate and Rhythm: Normal rate and regular rhythm.  Heart sounds: Normal heart sounds.  Pulmonary:     Effort: Pulmonary effort is normal. No respiratory distress.     Breath sounds: No wheezing.  Abdominal:     Palpations: Abdomen is soft.     Tenderness: There is no abdominal tenderness.  Musculoskeletal:        General: Normal range of motion.     Cervical back: Neck supple.     Comments: Normal range of motion of the left wrist, elbow, shoulder, no tenderness palpation, no deformities, no overlying skin changes  Skin:    General: Skin is warm and dry.  Neurological:     Mental Status: She is alert and oriented to person, place, and time.     Comments: 5 out of 5 grip, biceps, triceps, deltoid strength  Psychiatric:        Mood and Affect: Mood normal.     ED Results / Procedures / Treatments   Labs (all labs ordered are listed, but only abnormal results are displayed) Labs Reviewed  BASIC  METABOLIC PANEL - Abnormal; Notable for the following components:      Result Value   Creatinine, Ser 1.10 (*)    GFR, Estimated 55 (*)    All other components within normal limits  CBC  TROPONIN I (HIGH SENSITIVITY)  TROPONIN I (HIGH SENSITIVITY)    EKG EKG Interpretation  Date/Time:  Monday June 29 2021 15:44:00 EDT Ventricular Rate:  105 PR Interval:  156 QRS Duration: 86 QT Interval:  342 QTC Calculation: 452 R Axis:   -47 Text Interpretation: Sinus tachycardia Left anterior fascicular block Consider right ventricular hypertrophy Probable lateral infarct, old Confirmed by Thayer Jew [phone]) on 06/30/2021 3:22:54 AM  Radiology DG Chest 2 View  Result Date: 06/29/2021 CLINICAL DATA:  left shoulder and arm pain EXAM: CHEST - 2 VIEW COMPARISON:  Chest x-ray 08/16/2012 FINDINGS: The heart and mediastinal contours are within normal limits. Stable calcification overlying the anterior right middle lobe on lateral view. No focal consolidation. No pulmonary edema. No pleural effusion. No pneumothorax. No acute osseous abnormality. IMPRESSION: No active cardiopulmonary disease. Electronically Signed   By: Iven Finn M.D.   On: 06/29/2021 16:41    Procedures Procedures    Medications Ordered in ED Medications - No data to display  ED Course/ Medical Decision Making/ A&P                           Medical Decision Making  This patient presents to the ED for concern of left arm pain, this involves an extensive number of treatment options, and is a complaint that carries with it a high risk of complications and morbidity.  I considered the following differential and admission for this acute, potentially life threatening condition.  The differential diagnosis includes atypical ACS, musculoskeletal etiology such as muscle spasm, less likely nerve pain, not consistent with carpal tunnel  MDM:    This is a 66 year old female who presents with left arm pain.  She is nontoxic and  vital signs are notable for blood pressure 174/114.  EKG shows no evidence of acute ischemia or arrhythmia.  She has a benign physical exam.  No musculoskeletal abnormalities.  Normal range of motion and normal strength.  Troponin x2 negative.  Chest x-ray without pneumothorax or pneumonia.  Do not feel she needs imaging of the left upper extremity as I have low suspicion for fracture.  At this point she has effectively been ruled  out for atypical ACS.  She could have some muscle spasm.  She could also have some overuse given her job description.  Overall work-up reassuring.  She is persistently hypertensive.  No evidence of hypertensive urgency or emergency.  Recommend that she recheck her blood pressure in approximately 24 hours.  She should keep a log and follow-up closely with her primary physician to discuss med adjustment if needed.  (Labs, imaging, consults)  Labs: I Ordered, and personally interpreted labs.  The pertinent results include: CBC, BMP, negative troponin x2  Imaging Studies ordered: I ordered imaging studies including chest x-ray I independently visualized and interpreted imaging. I agree with the radiologist interpretation  Additional history obtained from chart review.  External records from outside source obtained and reviewed including prior evaluations  Cardiac Monitoring: The patient was maintained on a cardiac monitor.  I personally viewed and interpreted the cardiac monitored which showed an underlying rhythm of: Normal sinus rhythm  Reevaluation: After the interventions noted above, I reevaluated the patient and found that they have :improved  Social Determinants of Health: lives independently   Disposition:  d/c  Co morbidities that complicate the patient evaluation  Past Medical History:  Diagnosis Date   Allergy    Anemia    Hypertension    Inverted nipple    normal for her, always been like that she says     Medicines No orders of the defined  types were placed in this encounter.   I have reviewed the patients home medicines and have made adjustments as needed  Problem List / ED Course: Problem List Items Addressed This Visit       Cardiovascular and Mediastinum   HTN (hypertension)   Other Visit Diagnoses     Left arm pain    -  Primary                   Final Clinical Impression(s) / ED Diagnoses Final diagnoses:  Left arm pain  Primary hypertension    Rx / DC Orders ED Discharge Orders     None         Merryl Hacker, MD 06/30/21 [phone]

## 2021-06-30 NOTE — Discharge Instructions (Signed)
You were seen today for left arm pain and high blood pressure.  Your work-up today is very reassuring.  Your heart testing is negative.  Your pain may be related to musculoskeletal pain or spasm.  Continue Tylenol or ibuprofen at home for the next 2 to 3 days.  Recheck your blood pressure in 24 hours and start a log.  If this does not downtrend, you need to follow-up closely with your primary physician to discuss medication adjustment.

## 2021-07-16 ENCOUNTER — Encounter: Payer: 59 | Admitting: Family Medicine

## 2021-07-21 ENCOUNTER — Encounter: Payer: 59 | Admitting: Family Medicine

## 2021-07-22 ENCOUNTER — Ambulatory Visit
Admission: RE | Admit: 2021-07-22 | Discharge: 2021-07-22 | Disposition: A | Discharge status: Home / Self Care | Payer: 59 | Source: Ambulatory Visit | Attending: Family Medicine | Admitting: Family Medicine

## 2021-07-22 DIAGNOSIS — Z780 Asymptomatic menopausal state: Secondary | ICD-10-CM

## 2021-07-23 ENCOUNTER — Telehealth: Payer: Self-pay

## 2021-07-23 NOTE — Telephone Encounter (Signed)
Spoke w/ pt and advised of bone density results

## 2021-07-23 NOTE — Telephone Encounter (Signed)
-----   Message from Kennyth Arnold, [place] sent at 07/23/2021  9:16 AM EDT ----- Bone Density shows Osteopenia. Be sure you consume at least 800 iu of Vitamin D daily and 1500 mg of calcium daily.

## 2021-08-07 ENCOUNTER — Encounter: Payer: Self-pay | Admitting: Family Medicine

## 2021-08-07 ENCOUNTER — Ambulatory Visit (INDEPENDENT_AMBULATORY_CARE_PROVIDER_SITE_OTHER): Payer: 59 | Admitting: Family Medicine

## 2021-08-07 VITALS — BP 128/80 | HR 89 | Temp 97.7°F | Resp 16 | Ht 65.0 in | Wt 160.1 lb

## 2021-08-07 DIAGNOSIS — E559 Vitamin D deficiency, unspecified: Secondary | ICD-10-CM

## 2021-08-07 DIAGNOSIS — I10 Essential (primary) hypertension: Secondary | ICD-10-CM

## 2021-08-07 DIAGNOSIS — Z0000 Encounter for general adult medical examination without abnormal findings: Secondary | ICD-10-CM | POA: Diagnosis not present

## 2021-08-07 DIAGNOSIS — Z23 Encounter for immunization: Secondary | ICD-10-CM

## 2021-08-07 LAB — LIPID PANEL
Cholesterol: 208 mg/dL — ABNORMAL HIGH (ref 0–200)
HDL: 69.8 mg/dL (ref >39.00)
LDL Cholesterol: 121 mg/dL — ABNORMAL HIGH (ref 0–99)
NonHDL: 137.75
Total CHOL/HDL Ratio: 3
Triglycerides: 84 mg/dL (ref 0.0–149.0)
VLDL: 16.8 mg/dL (ref 0.0–40.0)

## 2021-08-07 LAB — BASIC METABOLIC PANEL
BUN: 20 mg/dL (ref 6–23)
CO2: 28 mEq/L (ref 19–32)
Calcium: 9.8 mg/dL (ref 8.4–10.5)
Chloride: 102 mEq/L (ref 96–112)
Creatinine, Ser: 1.17 mg/dL (ref 0.40–1.20)
GFR: 48.71 mL/min — ABNORMAL LOW (ref >60.00)
Glucose, Bld: 88 mg/dL (ref 70–99)
Potassium: 4.8 mEq/L (ref 3.5–5.1)
Sodium: 137 mEq/L (ref 135–145)

## 2021-08-07 LAB — CBC WITH DIFFERENTIAL/PLATELET
Basophils Absolute: 0.1 10*3/uL (ref 0.0–0.1)
Basophils Relative: 1.2 % (ref 0.0–3.0)
Eosinophils Absolute: 0.1 10*3/uL (ref 0.0–0.7)
Eosinophils Relative: 1.5 % (ref 0.0–5.0)
HCT: 42.5 % (ref 36.0–46.0)
Hemoglobin: 14.3 g/dL (ref 12.0–15.0)
Lymphocytes Relative: 33.5 % (ref 12.0–46.0)
Lymphs Abs: 1.6 10*3/uL (ref 0.7–4.0)
MCHC: 33.6 g/dL (ref 30.0–36.0)
MCV: 92 fl (ref 78.0–100.0)
Monocytes Absolute: 0.5 10*3/uL (ref 0.1–1.0)
Monocytes Relative: 9.8 % (ref 3.0–12.0)
Neutro Abs: 2.6 10*3/uL (ref 1.4–7.7)
Neutrophils Relative %: 54 % (ref 43.0–77.0)
Platelets: 241 10*3/uL (ref 150.0–400.0)
RBC: 4.62 Mil/uL (ref 3.87–5.11)
RDW: 13.8 % (ref 11.5–15.5)
WBC: 4.7 10*3/uL (ref 4.0–10.5)

## 2021-08-07 LAB — HEPATIC FUNCTION PANEL
ALT: 14 U/L (ref 0–35)
AST: 20 U/L (ref 0–37)
Albumin: 4.8 g/dL (ref 3.5–5.2)
Alkaline Phosphatase: 52 U/L (ref 39–117)
Bilirubin, Direct: 0.1 mg/dL (ref 0.0–0.3)
Total Bilirubin: 0.7 mg/dL (ref 0.2–1.2)
Total Protein: 7.6 g/dL (ref 6.0–8.3)

## 2021-08-07 LAB — TSH: TSH: 2.5 u[IU]/mL (ref 0.35–5.50)

## 2021-08-07 LAB — VITAMIN D 25 HYDROXY (VIT D DEFICIENCY, FRACTURES): VITD: 63.7 ng/mL (ref 30.00–100.00)

## 2021-08-07 MED ORDER — LISINOPRIL 10 MG PO TABS: 15.0000 mg | ORAL_TABLET | Freq: Every day | ORAL | 1 refills | Status: DC

## 2021-08-07 NOTE — Addendum Note (Signed)
Addended byPauline Good, Wade Asebedo on: 08/07/2021 10:35 AM   Modules accepted: Orders

## 2021-08-07 NOTE — Assessment & Plan Note (Signed)
Pt's PE WNL.  UTD on mammo, DEXA, cologuard, Tdap.  Will get Prevnar 20 today.  Check labs.  Anticipatory guidance provided.

## 2021-08-07 NOTE — Assessment & Plan Note (Signed)
Chronic problem.  Currently well controlled on increased dose of Lisinopril ('15mg'$ ).  Prescription updated to reflect dosage change.

## 2021-08-07 NOTE — Patient Instructions (Signed)
Follow up in 6 months to recheck BP and cholesterol We'll notify you of your lab results and make any changes if needed Keep up the good work on healthy diet and regular exercise- you look great!! Continue the Lisinopril '15mg'$  daily- your BP looks great! Call with any questions or concerns Stay Safe!  Stay Healthy! Hang in there!!!

## 2021-08-07 NOTE — Assessment & Plan Note (Signed)
Check labs.  Replete prn. 

## 2021-08-07 NOTE — Progress Notes (Signed)
   Subjective:    Patient ID: Audrey Glover, female    DOB: January 01, 1956, 66 y.o.   MRN: [id number]  HPI CPE- UTD on mammo, Tdap, cologuard, DEXA.  Will get Prevnar 20 today  Patient Care Team    Relationship Specialty Notifications Start End  Midge Minium, MD PCP - General Family Medicine  05/29/13   Princess Bruins, MD Consulting Physician Obstetrics and Gynecology  12/30/14     Health Maintenance  Topic Date Due   Pneumonia Vaccine 69+ Years old (1 - PCV) Never done   INFLUENZA VACCINE  08/11/2021   MAMMOGRAM  01/13/2022   TETANUS/TDAP  10/05/2022   Fecal DNA (Cologuard)  10/28/2022   DEXA SCAN  Completed   Hepatitis C Screening  Completed   HPV VACCINES  Aged Out   COLONOSCOPY (Pts 45-60yr Insurance coverage will need to be confirmed)  Discontinued   COVID-19 Vaccine  Discontinued   Zoster Vaccines- Shingrix  Discontinued      Review of Systems Patient reports no vision/ hearing changes, adenopathy,fever, weight change,  persistant/recurrent hoarseness , swallowing issues, chest pain, palpitations, edema, persistant/recurrent cough, hemoptysis, dyspnea (rest/exertional/paroxysmal nocturnal), gastrointestinal bleeding (melena, rectal bleeding), abdominal pain, significant heartburn, bowel changes, GU symptoms (dysuria, hematuria, incontinence), Gyn symptoms (abnormal  bleeding, pain),  syncope, focal weakness, memory loss, numbness & tingling, skin/hair/nail changes, abnormal bruising or bleeding, anxiety, or depression.     Objective:   Physical Exam General Appearance:    Alert, cooperative, no distress, appears stated age  Head:    Normocephalic, without obvious abnormality, atraumatic  Eyes:    PERRL, conjunctiva/corneas clear, EOM's intact both eyes  Ears:    Normal TM's and external ear canals, both ears  Nose:   Nares normal, septum midline, mucosa normal, no drainage    or sinus tenderness  Throat:   Lips, mucosa, and tongue normal; teeth and gums  normal  Neck:   Supple, symmetrical, trachea midline, no adenopathy;    Thyroid: no enlargement/tenderness/nodules  Back:     Symmetric, no curvature, ROM normal, no CVA tenderness  Lungs:     Clear to auscultation bilaterally, respirations unlabored  Chest Wall:    No tenderness or deformity   Heart:    Regular rate and rhythm, S1 and S2 normal, no murmur, rub   or gallop  Breast Exam:    Deferred to mammo  Abdomen:     Soft, non-tender, bowel sounds active all four quadrants,    no masses, no organomegaly  Genitalia:    Deferred to GYN  Rectal:    Extremities:   Extremities normal, atraumatic, no cyanosis or edema  Pulses:   2+ and symmetric all extremities  Skin:   Skin color, texture, turgor normal, no rashes or lesions  Lymph nodes:   Cervical, supraclavicular, and axillary nodes normal  Neurologic:   CNII-XII intact, normal strength, sensation and reflexes    throughout          Assessment & Plan:

## 2021-08-10 ENCOUNTER — Telehealth: Payer: Self-pay

## 2021-08-10 NOTE — Telephone Encounter (Signed)
Informed pt of lab results  

## 2021-08-10 NOTE — Telephone Encounter (Signed)
-----   Message from Midge Minium, MD sent at 08/09/2021  5:22 PM EDT ----- Labs look great w/ exception of mildly decreased GFR (kidney function).  Please make sure you are drinking plenty of water- especially in this heat!

## 2021-09-28 ENCOUNTER — Other Ambulatory Visit: Payer: Self-pay | Admitting: Family Medicine

## 2021-09-28 DIAGNOSIS — R7989 Other specified abnormal findings of blood chemistry: Secondary | ICD-10-CM

## 2021-11-22 ENCOUNTER — Other Ambulatory Visit: Payer: Self-pay | Admitting: Family Medicine

## 2022-01-20 ENCOUNTER — Other Ambulatory Visit: Payer: Self-pay | Admitting: Family Medicine

## 2022-01-20 DIAGNOSIS — Z1231 Encounter for screening mammogram for malignant neoplasm of breast: Secondary | ICD-10-CM

## 2022-01-21 ENCOUNTER — Other Ambulatory Visit: Payer: Self-pay | Admitting: Family Medicine

## 2022-01-21 NOTE — Telephone Encounter (Signed)
Just wanted to verify before I send the refill Rx requested states one tablet daily but directions on our system say take 1.5 tablets daily just wanted to be sure before I send this

## 2022-01-22 ENCOUNTER — Ambulatory Visit
Admission: RE | Admit: 2022-01-22 | Discharge: 2022-01-22 | Disposition: A | Discharge status: Home / Self Care | Payer: Medicare Other | Source: Ambulatory Visit | Attending: Family Medicine | Admitting: Family Medicine

## 2022-01-22 DIAGNOSIS — Z1231 Encounter for screening mammogram for malignant neoplasm of breast: Secondary | ICD-10-CM

## 2022-02-08 ENCOUNTER — Ambulatory Visit: Payer: 59 | Admitting: Family Medicine

## 2022-02-10 ENCOUNTER — Ambulatory Visit (INDEPENDENT_AMBULATORY_CARE_PROVIDER_SITE_OTHER): Payer: Medicare Other | Admitting: Family Medicine

## 2022-02-10 ENCOUNTER — Encounter: Payer: Self-pay | Admitting: Family Medicine

## 2022-02-10 VITALS — BP 116/80 | HR 83 | Temp 97.8°F | Resp 18 | Ht 65.0 in | Wt 165.0 lb

## 2022-02-10 DIAGNOSIS — I10 Essential (primary) hypertension: Secondary | ICD-10-CM

## 2022-02-10 DIAGNOSIS — E663 Overweight: Secondary | ICD-10-CM

## 2022-02-10 DIAGNOSIS — E785 Hyperlipidemia, unspecified: Secondary | ICD-10-CM | POA: Diagnosis not present

## 2022-02-10 LAB — CBC WITH DIFFERENTIAL/PLATELET
Basophils Absolute: 0 10*3/uL (ref 0.0–0.1)
Basophils Relative: 0.7 % (ref 0.0–3.0)
Eosinophils Absolute: 0.1 10*3/uL (ref 0.0–0.7)
Eosinophils Relative: 1.8 % (ref 0.0–5.0)
HCT: 41.6 % (ref 36.0–46.0)
Hemoglobin: 14.4 g/dL (ref 12.0–15.0)
Lymphocytes Relative: 32.6 % (ref 12.0–46.0)
Lymphs Abs: 1.6 10*3/uL (ref 0.7–4.0)
MCHC: 34.6 g/dL (ref 30.0–36.0)
MCV: 91.7 fl (ref 78.0–100.0)
Monocytes Absolute: 0.5 10*3/uL (ref 0.1–1.0)
Monocytes Relative: 10.2 % (ref 3.0–12.0)
Neutro Abs: 2.7 10*3/uL (ref 1.4–7.7)
Neutrophils Relative %: 54.7 % (ref 43.0–77.0)
Platelets: 227 10*3/uL (ref 150.0–400.0)
RBC: 4.53 Mil/uL (ref 3.87–5.11)
RDW: 13.2 % (ref 11.5–15.5)
WBC: 5 10*3/uL (ref 4.0–10.5)

## 2022-02-10 LAB — LIPID PANEL
Cholesterol: 218 mg/dL — ABNORMAL HIGH (ref 0–200)
HDL: 64 mg/dL (ref >39.00)
LDL Cholesterol: 135 mg/dL — ABNORMAL HIGH (ref 0–99)
NonHDL: 154.38
Total CHOL/HDL Ratio: 3
Triglycerides: 96 mg/dL (ref 0.0–149.0)
VLDL: 19.2 mg/dL (ref 0.0–40.0)

## 2022-02-10 LAB — BASIC METABOLIC PANEL
BUN: 19 mg/dL (ref 6–23)
CO2: 26 mEq/L (ref 19–32)
Calcium: 9.4 mg/dL (ref 8.4–10.5)
Chloride: 99 mEq/L (ref 96–112)
Creatinine, Ser: 1.14 mg/dL (ref 0.40–1.20)
GFR: 50.07 mL/min — ABNORMAL LOW (ref >60.00)
Glucose, Bld: 81 mg/dL (ref 70–99)
Potassium: 4.3 mEq/L (ref 3.5–5.1)
Sodium: 133 mEq/L — ABNORMAL LOW (ref 135–145)

## 2022-02-10 LAB — HEPATIC FUNCTION PANEL
ALT: 15 U/L (ref 0–35)
AST: 17 U/L (ref 0–37)
Albumin: 4.6 g/dL (ref 3.5–5.2)
Alkaline Phosphatase: 47 U/L (ref 39–117)
Bilirubin, Direct: 0.1 mg/dL (ref 0.0–0.3)
Total Bilirubin: 0.6 mg/dL (ref 0.2–1.2)
Total Protein: 7 g/dL (ref 6.0–8.3)

## 2022-02-10 LAB — TSH: TSH: 3.27 u[IU]/mL (ref 0.35–5.50)

## 2022-02-10 MED ORDER — LISINOPRIL 20 MG PO TABS: 20.0000 mg | ORAL_TABLET | Freq: Every day | ORAL | 3 refills | Status: DC

## 2022-02-10 NOTE — Assessment & Plan Note (Signed)
Chronic problem.  Pt reports BP had been running high on '15mg'$  Lisinopril daily so she increased to '20mg'$  daily and BP's have been well controlled since.  Currently asymptomatic.  Check labs due to ACE but no anticipated med changes.  Will follow.

## 2022-02-10 NOTE — Assessment & Plan Note (Signed)
Chronic problem.  Currently on Zetia '10mg'$  daily w/o difficulty.  Check labs.  Adjust meds prn

## 2022-02-10 NOTE — Patient Instructions (Signed)
Schedule your complete physical in 6 months We'll notify you of your lab results and make any changes if needed Keep up the good work on healthy diet and regular exercise- you're doing great! Call with any questions or concerns Stay Safe!  Stay Healthy!

## 2022-02-10 NOTE — Assessment & Plan Note (Signed)
Pt has gained 5 lbs since last visit and is frustrated by this.  She reports she is walking for an hour at a time at least 4x/week.  Encouraged low carb/low sugar diet.  Will continue to follow.

## 2022-02-10 NOTE — Progress Notes (Signed)
   Subjective:    Patient ID: Audrey Glover, female    DOB: April 01, 1955, 67 y.o.   MRN: [id number]  HPI HTN- chronic problem, on Lisinopril '15mg'$  w/ excellent control.  Pt reports that in December BP increased to 140s/100s.  HR was also high.  At that time, she increased Lisinopril to '20mg'$  daily and sxs resolved.  No CP, SOB, HA's, visual changes, edema.  Hyperlipidemia- chronic problem, on Zetia '10mg'$  daily.  No abd pain, N/V  Overweight- pt has gained 5 lbs since last visit.  Pt reports walking for 60 minutes 4x/week.  Plans to add weight training.     Review of Systems For ROS see HPI     Objective:   Physical Exam Vitals reviewed.  Constitutional:      General: She is not in acute distress.    Appearance: Normal appearance. She is well-developed. She is not ill-appearing.  HENT:     Head: Normocephalic and atraumatic.  Eyes:     Conjunctiva/sclera: Conjunctivae normal.     Pupils: Pupils are equal, round, and reactive to light.  Neck:     Thyroid: No thyromegaly.  Cardiovascular:     Rate and Rhythm: Normal rate and regular rhythm.     Pulses: Normal pulses.     Heart sounds: Normal heart sounds. No murmur heard. Pulmonary:     Effort: Pulmonary effort is normal. No respiratory distress.     Breath sounds: Normal breath sounds.  Abdominal:     General: There is no distension.     Palpations: Abdomen is soft.     Tenderness: There is no abdominal tenderness.  Musculoskeletal:     Cervical back: Normal range of motion and neck supple.     Right lower leg: No edema.     Left lower leg: No edema.  Lymphadenopathy:     Cervical: No cervical adenopathy.  Skin:    General: Skin is warm and dry.  Neurological:     General: No focal deficit present.     Mental Status: She is alert and oriented to person, place, and time.  Psychiatric:        Mood and Affect: Mood normal.        Behavior: Behavior normal.           Assessment & Plan:

## 2022-02-11 ENCOUNTER — Telehealth: Payer: Self-pay

## 2022-02-11 NOTE — Telephone Encounter (Signed)
Informed pt of lab results  

## 2022-02-11 NOTE — Telephone Encounter (Signed)
-----  Message from Midge Minium, MD sent at 02/11/2022  7:30 AM EST ----- Total cholesterol and LDL (bad cholesterol) have both increased since last check.  Thankfully the ratio of good to bad is still excellent.  Continue the Zetia daily along w/ attention to healthy diet and regular exercise.  Remainder of labs are stable and look good

## 2022-02-25 ENCOUNTER — Encounter: Payer: Self-pay | Admitting: Family Medicine

## 2022-02-25 ENCOUNTER — Ambulatory Visit (INDEPENDENT_AMBULATORY_CARE_PROVIDER_SITE_OTHER): Payer: Medicare Other | Admitting: Family Medicine

## 2022-02-25 VITALS — BP 116/80 | HR 74 | Temp 97.9°F | Resp 16 | Ht 65.0 in | Wt 162.2 lb

## 2022-02-25 DIAGNOSIS — D225 Melanocytic nevi of trunk: Secondary | ICD-10-CM | POA: Diagnosis not present

## 2022-02-25 NOTE — Patient Instructions (Signed)
Follow up as needed or as scheduled We'll call you to schedule your Dermatology appt Try not to pick or scratch the area Call with any questions or concerns Stay Safe!  Stay Healthy!

## 2022-02-25 NOTE — Progress Notes (Signed)
   Subjective:    Patient ID: Audrey Glover, female    DOB: 05/24/1955, 67 y.o.   MRN: [id number]  HPI Mole- pt reports area 'might have gotten darker'.  Located on back so difficult for her to see.  No bleeding, cracking, itching, burning.  Noticed 'within the last couple of weeks'.   Review of Systems For ROS see HPI     Objective:   Physical Exam Vitals reviewed.  Constitutional:      General: She is not in acute distress.    Appearance: Normal appearance. She is not ill-appearing.  HENT:     Head: Normocephalic and atraumatic.  Skin:    General: Skin is warm and dry.     Findings: Lesion (hyperpigmented smooth lesion on R upper back near scapula) present.  Neurological:     General: No focal deficit present.     Mental Status: She is alert and oriented to person, place, and time.  Psychiatric:        Mood and Affect: Mood normal.        Behavior: Behavior normal.        Thought Content: Thought content normal.           Assessment & Plan:  Atypical nevus- new.  Pt reports area is now darker than it has been.  Does not appear to be a keratosis like other areas on her back and is concerning for a changing mole.  Will refer to Derm for a complete evaluation and tx prn.  Pt expressed understanding and is in agreement w/ plan.

## 2022-04-08 ENCOUNTER — Other Ambulatory Visit: Payer: Self-pay | Admitting: Family Medicine

## 2022-04-08 DIAGNOSIS — R7989 Other specified abnormal findings of blood chemistry: Secondary | ICD-10-CM

## 2022-04-27 IMAGING — MG MM DIGITAL SCREENING BILAT W/ TOMO AND CAD
8 series · 9 of 24 positions shown · non-contrast
Comparison: Previous exam(s).

CLINICAL DATA: Screening.

EXAM:
DIGITAL SCREENING BILATERAL MAMMOGRAM WITH TOMOSYNTHESIS AND CAD
TECHNIQUE: Bilateral screening digital craniocaudal and mediolateral oblique
mammograms were obtained. Bilateral screening digital breast
tomosynthesis was performed. The images were evaluated with
computer-aided detection.

[R MLO synth-2D]
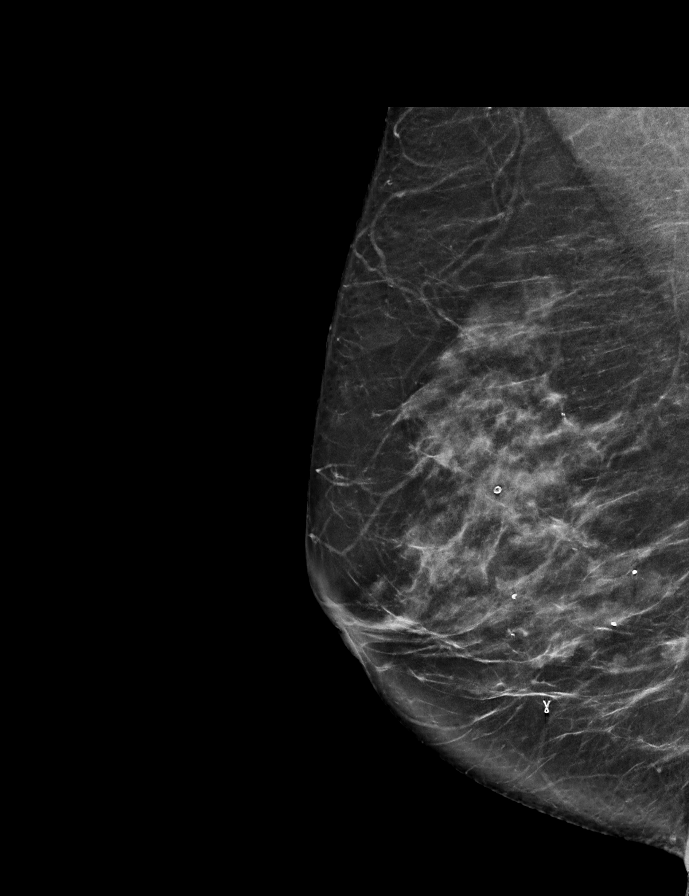

[L CC synth-2D]
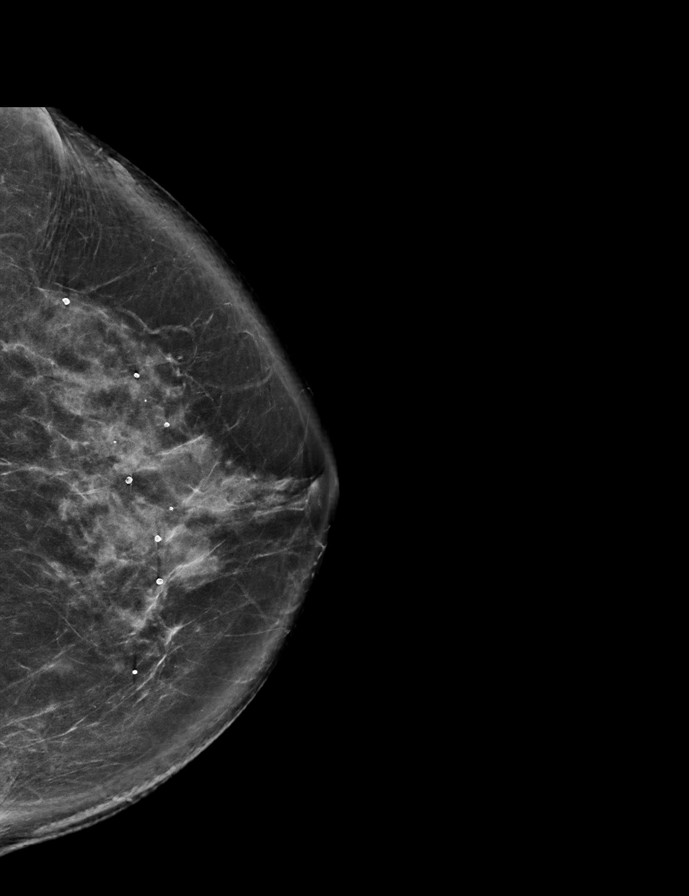

[L MLO synth-2D]
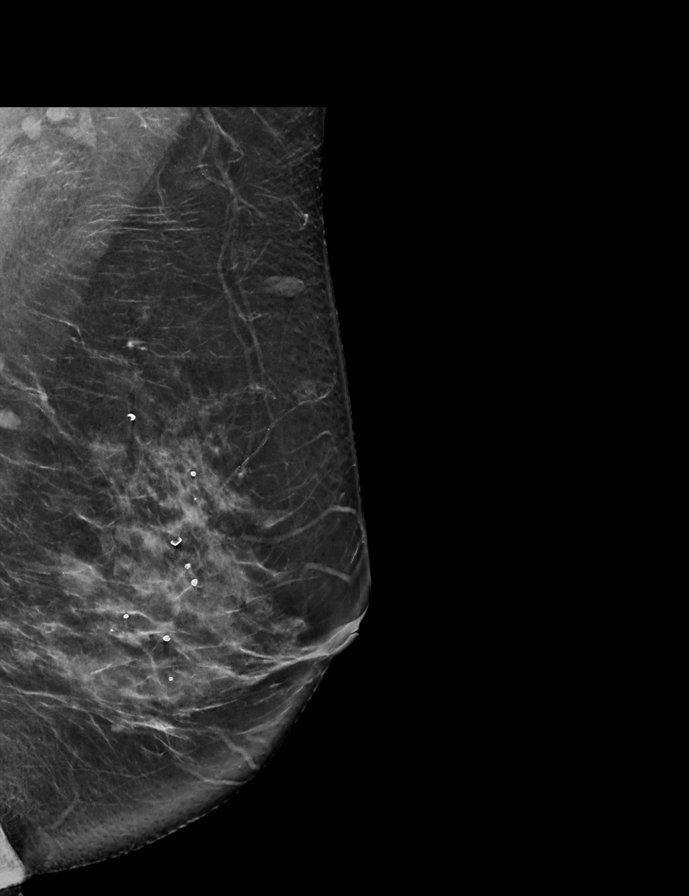

[R CC synth-2D]
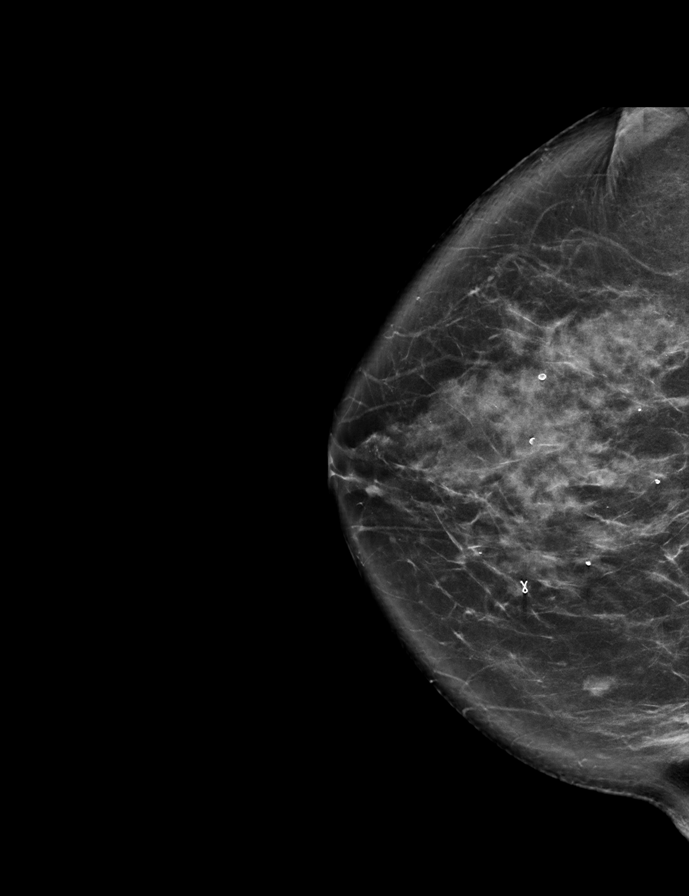

[R CC tomo · 2 of 79 frames shown]
[frame 26/79]
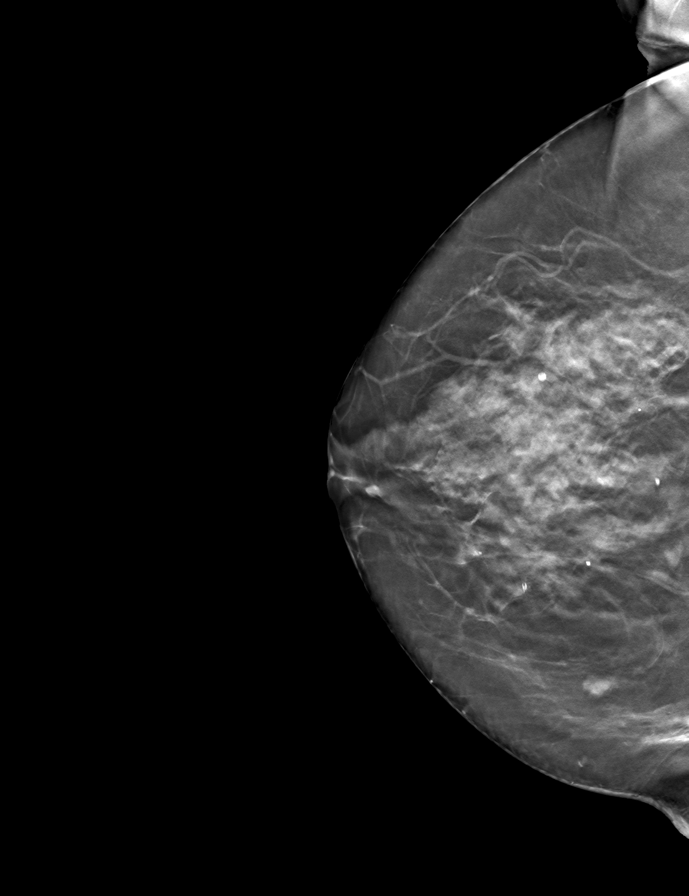
[frame 40/79]
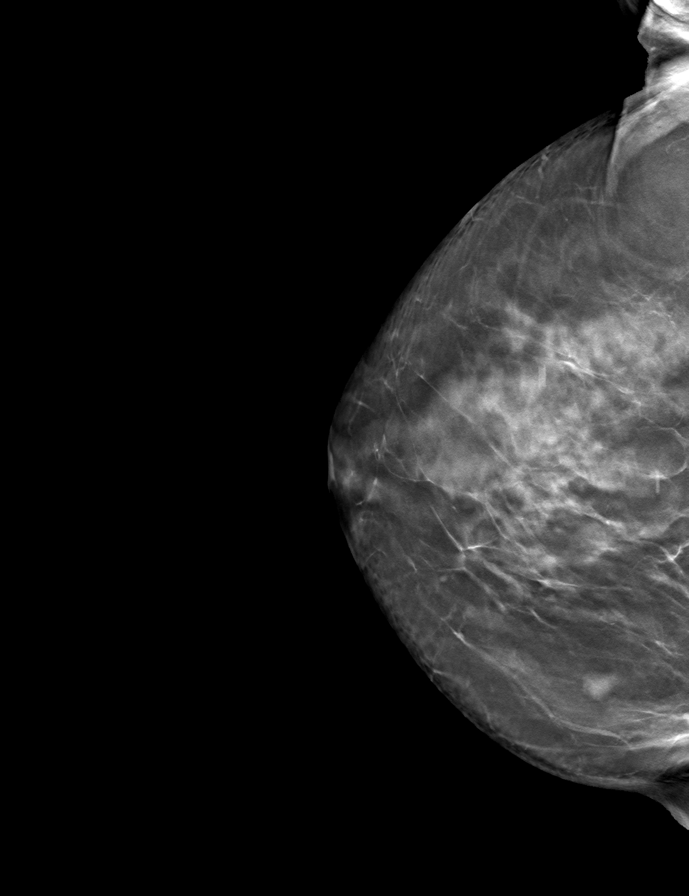

[R MLO tomo · tomo slice 37/74.0]
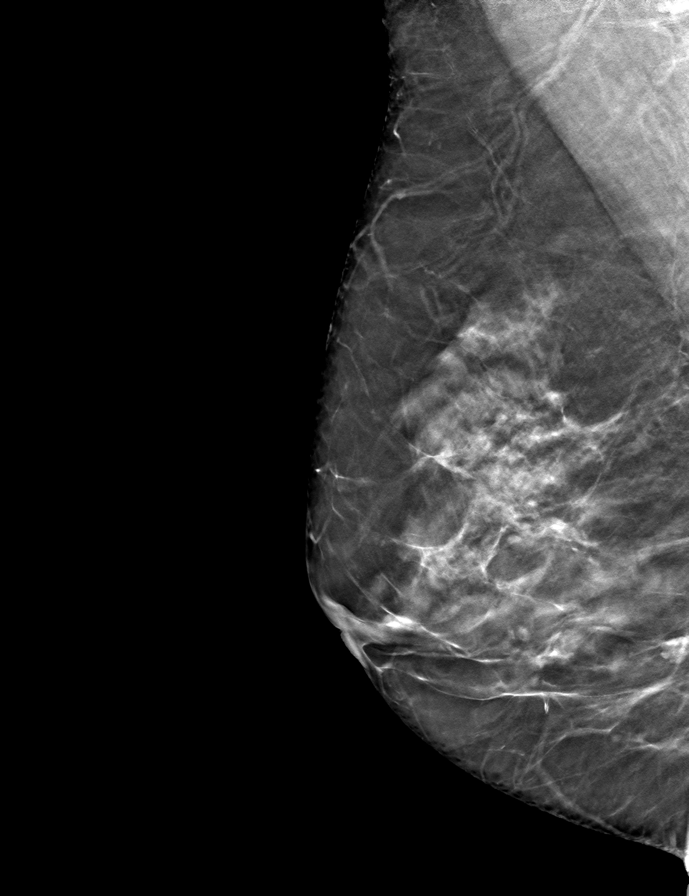

[L MLO tomo · tomo slice 41/80.0]
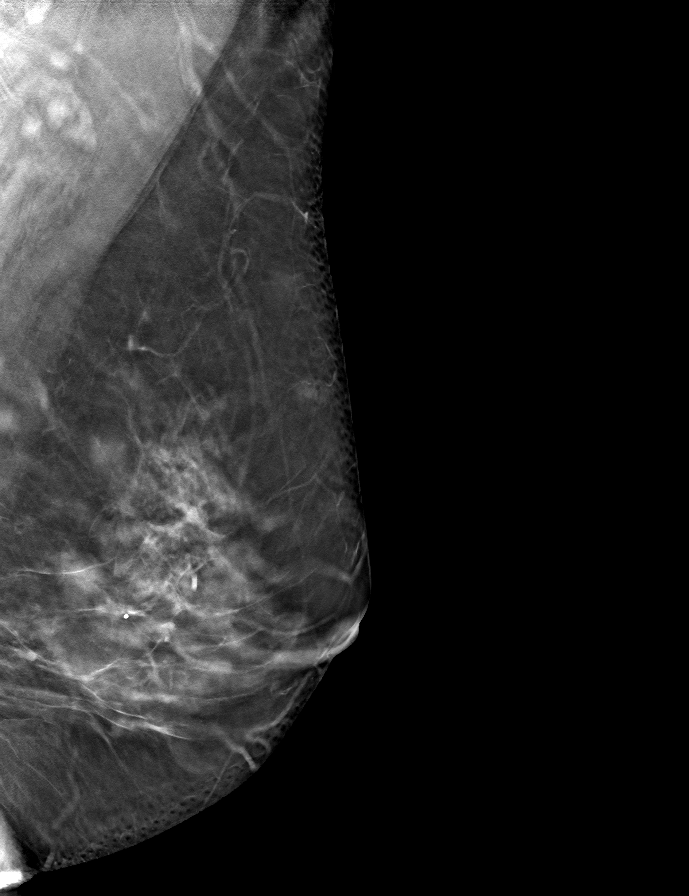

[L CC tomo · tomo slice 43/84.0]
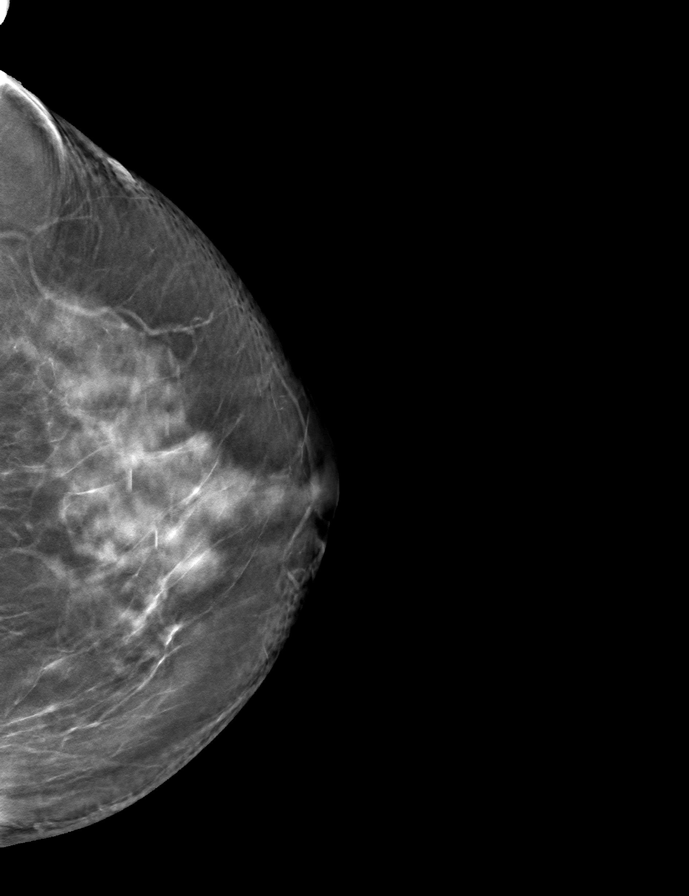

[9 of 24 positions shown; findings below may reference images not displayed]

ACR Breast Density Category c: The breast tissue is heterogeneously
dense, which may obscure small masses.
FINDINGS: There are no findings suspicious for malignancy.
IMPRESSION: No mammographic evidence of malignancy. A result letter of this
screening mammogram will be mailed directly to the patient.

RECOMMENDATION:
Screening mammogram in one year. (Code:Q3-W-BC3)

BI-RADS CATEGORY  1: Negative.

## 2022-05-26 ENCOUNTER — Other Ambulatory Visit: Payer: Self-pay | Admitting: Family Medicine

## 2022-07-30 ENCOUNTER — Encounter: Payer: Medicare Other | Admitting: Family Medicine

## 2022-08-09 ENCOUNTER — Encounter: Payer: Medicare Other | Admitting: Family Medicine

## 2022-08-13 ENCOUNTER — Encounter: Payer: Self-pay | Admitting: Family Medicine

## 2022-08-13 ENCOUNTER — Ambulatory Visit (INDEPENDENT_AMBULATORY_CARE_PROVIDER_SITE_OTHER): Payer: Medicare Other | Admitting: Family Medicine

## 2022-08-13 VITALS — BP 122/82 | HR 75 | Temp 97.8°F | Resp 17 | Ht 65.0 in | Wt 160.4 lb

## 2022-08-13 DIAGNOSIS — Z0000 Encounter for general adult medical examination without abnormal findings: Secondary | ICD-10-CM | POA: Diagnosis not present

## 2022-08-13 DIAGNOSIS — E559 Vitamin D deficiency, unspecified: Secondary | ICD-10-CM

## 2022-08-13 DIAGNOSIS — I10 Essential (primary) hypertension: Secondary | ICD-10-CM | POA: Diagnosis not present

## 2022-08-13 LAB — CBC WITH DIFFERENTIAL/PLATELET
Basophils Absolute: 0 10*3/uL (ref 0.0–0.1)
Basophils Relative: 0.7 % (ref 0.0–3.0)
Eosinophils Absolute: 0.1 10*3/uL (ref 0.0–0.7)
Eosinophils Relative: 2.2 % (ref 0.0–5.0)
HCT: 41.4 % (ref 36.0–46.0)
Hemoglobin: 13.8 g/dL (ref 12.0–15.0)
Lymphocytes Relative: 29.2 % (ref 12.0–46.0)
Lymphs Abs: 1.6 10*3/uL (ref 0.7–4.0)
MCHC: 33.4 g/dL (ref 30.0–36.0)
MCV: 92.1 fl (ref 78.0–100.0)
Monocytes Absolute: 0.6 10*3/uL (ref 0.1–1.0)
Monocytes Relative: 10.3 % (ref 3.0–12.0)
Neutro Abs: 3.2 10*3/uL (ref 1.4–7.7)
Neutrophils Relative %: 57.6 % (ref 43.0–77.0)
Platelets: 334 10*3/uL (ref 150.0–400.0)
RBC: 4.49 Mil/uL (ref 3.87–5.11)
RDW: 13.2 % (ref 11.5–15.5)
WBC: 5.5 10*3/uL (ref 4.0–10.5)

## 2022-08-13 LAB — HEPATIC FUNCTION PANEL
ALT: 16 U/L (ref 0–35)
AST: 16 U/L (ref 0–37)
Albumin: 4.3 g/dL (ref 3.5–5.2)
Alkaline Phosphatase: 48 U/L (ref 39–117)
Bilirubin, Direct: 0.1 mg/dL (ref 0.0–0.3)
Total Bilirubin: 0.5 mg/dL (ref 0.2–1.2)
Total Protein: 6.9 g/dL (ref 6.0–8.3)

## 2022-08-13 LAB — LIPID PANEL
Cholesterol: 206 mg/dL — ABNORMAL HIGH (ref 0–200)
HDL: 48.1 mg/dL (ref >39.00)
LDL Cholesterol: 130 mg/dL — ABNORMAL HIGH (ref 0–99)
NonHDL: 158.04
Total CHOL/HDL Ratio: 4
Triglycerides: 141 mg/dL (ref 0.0–149.0)
VLDL: 28.2 mg/dL (ref 0.0–40.0)

## 2022-08-13 LAB — TSH: TSH: 3.42 u[IU]/mL (ref 0.35–5.50)

## 2022-08-13 LAB — BASIC METABOLIC PANEL
BUN: 14 mg/dL (ref 6–23)
CO2: 28 mEq/L (ref 19–32)
Calcium: 9.2 mg/dL (ref 8.4–10.5)
Chloride: 100 mEq/L (ref 96–112)
Creatinine, Ser: 1.14 mg/dL (ref 0.40–1.20)
GFR: 49.9 mL/min — ABNORMAL LOW (ref >60.00)
Glucose, Bld: 97 mg/dL (ref 70–99)
Potassium: 4.1 mEq/L (ref 3.5–5.1)
Sodium: 136 mEq/L (ref 135–145)

## 2022-08-13 LAB — VITAMIN D 25 HYDROXY (VIT D DEFICIENCY, FRACTURES): VITD: 58.16 ng/mL (ref 30.00–100.00)

## 2022-08-13 MED ORDER — ALBUTEROL SULFATE HFA 108 (90 BASE) MCG/ACT IN AERS: 2.0000 | INHALATION_SPRAY | Freq: Four times a day (QID) | RESPIRATORY_TRACT | 0 refills | Status: AC | PRN

## 2022-08-13 NOTE — Patient Instructions (Signed)
Follow up in 6 months to recheck blood pressure and cholesterol We'll notify you of your lab results and make any changes if needed Keep up the good work on healthy diet and regular exercise- you look great! Call with any questions or concerns Stay Safe!  Stay Healthy! Enjoy the rest of your summer!!!

## 2022-08-13 NOTE — Progress Notes (Signed)
   Subjective:    Patient ID: Audrey Glover, female    DOB: 12-23-1955, 67 y.o.   MRN: [id number]  HPI CPE- UTD on Tdap, cologuard, mammo, PNA  Patient Care Team    Relationship Specialty Notifications Start End  Sheliah Hatch, MD PCP - General Family Medicine  05/29/13   Genia Del, MD Consulting Physician Obstetrics and Gynecology  12/30/14     Health Maintenance  Topic Date Due   Medicare Annual Wellness (AWV)  Never done   INFLUENZA VACCINE  04/11/2023 (Originally 08/12/2022)   DTaP/Tdap/Td (3 - Td or Tdap) 10/05/2022   Fecal DNA (Cologuard)  10/28/2022   MAMMOGRAM  01/23/2023   Pneumonia Vaccine 71+ Years old  Completed   DEXA SCAN  Completed   Hepatitis C Screening  Completed   HPV VACCINES  Aged Out   Colonoscopy  Discontinued   COVID-19 Vaccine  Discontinued   Zoster Vaccines- Shingrix  Discontinued      Review of Systems Patient reports no vision/ hearing changes, adenopathy,fever, weight change,  persistant/recurrent hoarseness , swallowing issues, chest pain, palpitations, edema, persistant/recurrent cough, hemoptysis, dyspnea (rest/exertional/paroxysmal nocturnal), gastrointestinal bleeding (melena, rectal bleeding), abdominal pain, significant heartburn, bowel changes, GU symptoms (dysuria, hematuria, incontinence), Gyn symptoms (abnormal  bleeding, pain),  syncope, focal weakness, memory loss, numbness & tingling, skin/hair/nail changes, abnormal bruising or bleeding, anxiety, or depression.     Objective:   Physical Exam General Appearance:    Alert, cooperative, no distress, appears stated age  Head:    Normocephalic, without obvious abnormality, atraumatic  Eyes:    PERRL, conjunctiva/corneas clear, EOM's intact both eyes  Ears:    Normal TM's and external ear canals, both ears  Nose:   Nares normal, septum midline, mucosa normal, no drainage    or sinus tenderness  Throat:   Lips, mucosa, and tongue normal; teeth and gums normal  Neck:    Supple, symmetrical, trachea midline, no adenopathy;    Thyroid: no enlargement/tenderness/nodules  Back:     Symmetric, no curvature, ROM normal, no CVA tenderness  Lungs:     Clear to auscultation bilaterally, respirations unlabored  Chest Wall:    No tenderness or deformity   Heart:    Regular rate and rhythm, S1 and S2 normal, no murmur, rub   or gallop  Breast Exam:    Deferred to GYN  Abdomen:     Soft, non-tender, bowel sounds active all four quadrants,    no masses, no organomegaly  Genitalia:    Deferred to GYN  Rectal:    Extremities:   Extremities normal, atraumatic, no cyanosis or edema  Pulses:   2+ and symmetric all extremities  Skin:   Skin color, texture, turgor normal, no rashes or lesions  Lymph nodes:   Cervical, supraclavicular, and axillary nodes normal  Neurologic:   CNII-XII intact, normal strength, sensation and reflexes    throughout          Assessment & Plan:

## 2022-08-13 NOTE — Assessment & Plan Note (Signed)
Pt's PE WNL.  UTD on mammo, cologuard, immunizations.  Encouraged healthy diet and regular exercise.  She plans to complete living will documents at [facility].  Check labs.

## 2022-08-13 NOTE — Assessment & Plan Note (Signed)
Check labs to replete prn.

## 2022-08-13 NOTE — Assessment & Plan Note (Signed)
Chronic problem.  Well controlled.  Currently asymptomatic

## 2022-08-16 ENCOUNTER — Telehealth: Payer: Self-pay

## 2022-08-16 NOTE — Telephone Encounter (Signed)
-----   Message from Neena Rhymes sent at 08/16/2022  7:36 AM EDT ----- Labs are stable and look great!  No changes at this time

## 2022-08-16 NOTE — Telephone Encounter (Signed)
Left Vm stating lab results

## 2022-08-18 DIAGNOSIS — H2513 Age-related nuclear cataract, bilateral: Secondary | ICD-10-CM | POA: Diagnosis not present

## 2022-08-30 DIAGNOSIS — J029 Acute pharyngitis, unspecified: Secondary | ICD-10-CM | POA: Diagnosis not present

## 2022-09-01 ENCOUNTER — Ambulatory Visit (INDEPENDENT_AMBULATORY_CARE_PROVIDER_SITE_OTHER): Payer: Medicare Other | Admitting: Family Medicine

## 2022-09-01 ENCOUNTER — Encounter: Payer: Self-pay | Admitting: Family Medicine

## 2022-09-01 VITALS — BP 126/84 | HR 73 | Temp 97.8°F | Resp 18 | Ht 66.5 in | Wt 162.1 lb

## 2022-09-01 DIAGNOSIS — Z0000 Encounter for general adult medical examination without abnormal findings: Secondary | ICD-10-CM | POA: Diagnosis not present

## 2022-09-01 NOTE — Patient Instructions (Signed)
Follow up as needed or as scheduled Not need to repeat labs today- yay! Keep up the good work!  You look great! Call with any questions or concerns Fill out the Advanced Directive paperwork and return at your convenience Call with any questions or concerns Happy Labor Day!!

## 2022-09-01 NOTE — Progress Notes (Signed)
   Subjective:    Patient ID: Audrey Glover, female    DOB: 14-Jun-1955, 67 y.o.   MRN: [id number]  HPI Here today for Welcome To Medicare.  Risk Factors: HTN- chronic problem, well controlled on Lisinopril 20mg  daily Hypothyroid- chronic problem, on Levothyroxine daily Hyperlipidemia- chronic problem, on Zetia 10mg  daily due to statin intolerance Overweight- ongoing issue, BMI 25.78 Physical Activity: walking regularly Fall Risk: low risk Depression: currently asymptomatic Hearing: normal to conversational tones and whispered voice ADL's: independent Cognitive: performed well on minicog, no concerns Home Safety: pt reports feeling safe at home, lives alone Height, Weight, BMI, Visual Acuity: see vitals, vision corrected to 20/20 w/ glasses Counseling:  UTD on mammo, cologuard, immunizations. Health Care POA: does not have one Living Will: pt would like documents to complete today Substance abuse screen- low risk for alcohol abuse, not on any controlled substances Labs Ordered: See A&P Care Plan: See A&P    Review of Systems No CP, SOB, HA's, visual changes, abd pain, N/V, changes to bowel/bladder habits, no passing out, no numbness/tingling of hands/feet.    Objective:   Physical Exam Vitals reviewed.  Constitutional:      General: She is not in acute distress.    Appearance: Normal appearance. She is well-developed. She is not ill-appearing.  HENT:     Head: Normocephalic and atraumatic.  Eyes:     Conjunctiva/sclera: Conjunctivae normal.     Pupils: Pupils are equal, round, and reactive to light.  Neck:     Thyroid: No thyromegaly.  Cardiovascular:     Rate and Rhythm: Normal rate and regular rhythm.     Pulses: Normal pulses.     Heart sounds: Normal heart sounds. No murmur heard. Pulmonary:     Effort: Pulmonary effort is normal. No respiratory distress.     Breath sounds: Normal breath sounds.  Abdominal:     General: There is no distension.      Palpations: Abdomen is soft.     Tenderness: There is no abdominal tenderness.  Musculoskeletal:     Cervical back: Normal range of motion and neck supple.     Right lower leg: No edema.     Left lower leg: No edema.  Lymphadenopathy:     Cervical: No cervical adenopathy.  Skin:    General: Skin is warm and dry.  Neurological:     General: No focal deficit present.     Mental Status: She is alert and oriented to person, place, and time.  Psychiatric:        Mood and Affect: Mood normal.        Behavior: Behavior normal.        Thought Content: Thought content normal.           Assessment & Plan:   Welcome to Medicare- pt had her physical earlier this month.  Today we reviewed her overall state of health (mental, emotional, physical).  She is UTD on all preventative measures- plans to get flu shot when it's available.  Forms were provided for health care POA and living will.  No need to repeat labs.

## 2022-10-08 ENCOUNTER — Other Ambulatory Visit: Payer: Self-pay | Admitting: Family Medicine

## 2022-10-08 DIAGNOSIS — R7989 Other specified abnormal findings of blood chemistry: Secondary | ICD-10-CM

## 2022-10-11 IMAGING — CR DG CHEST 2V
2 series · 2 of 2 positions shown · non-contrast
Comparison: Chest x-ray 08/16/2012

CLINICAL DATA: left shoulder and arm pain

EXAM:
CHEST - 2 VIEW

[w chest pa]
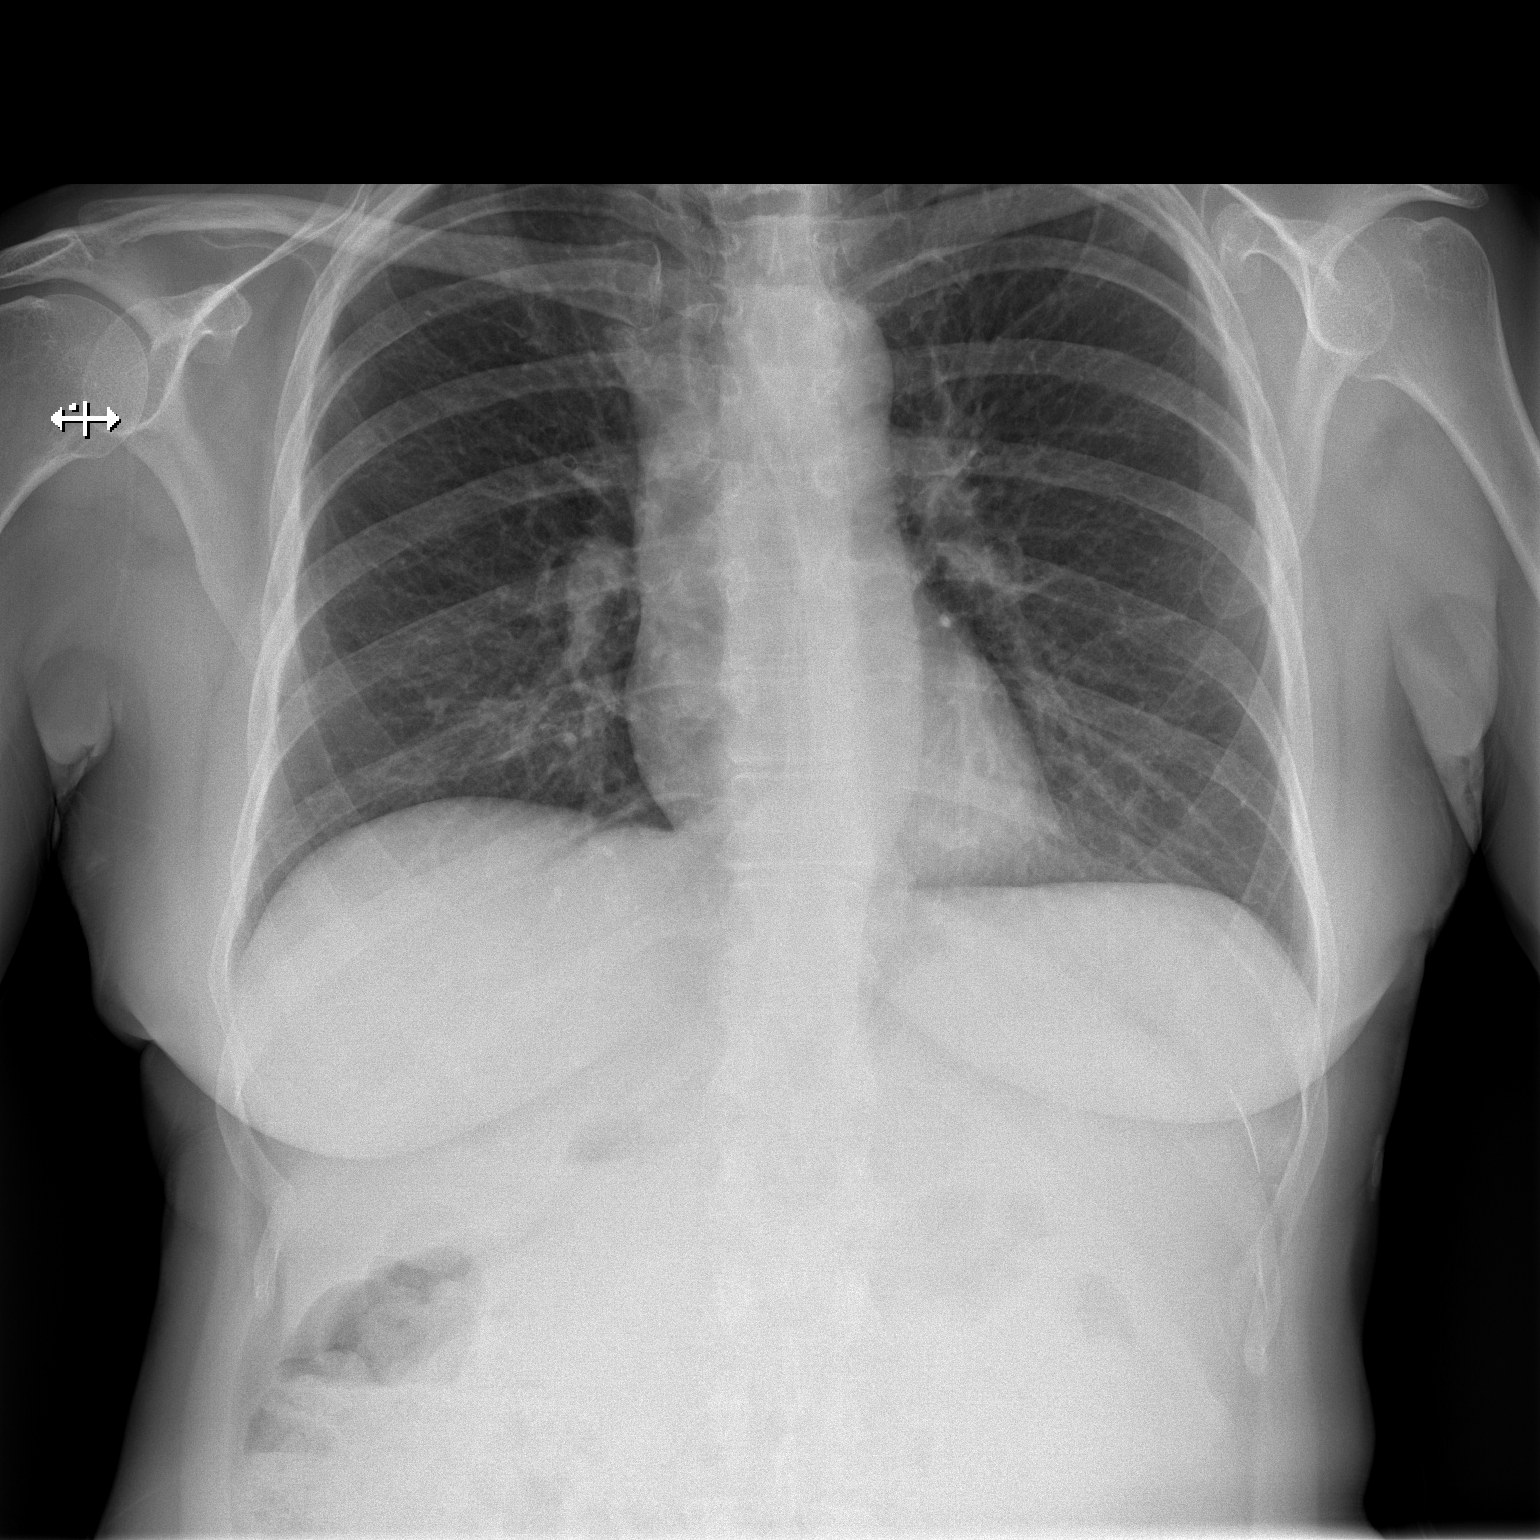

[w chest lat]
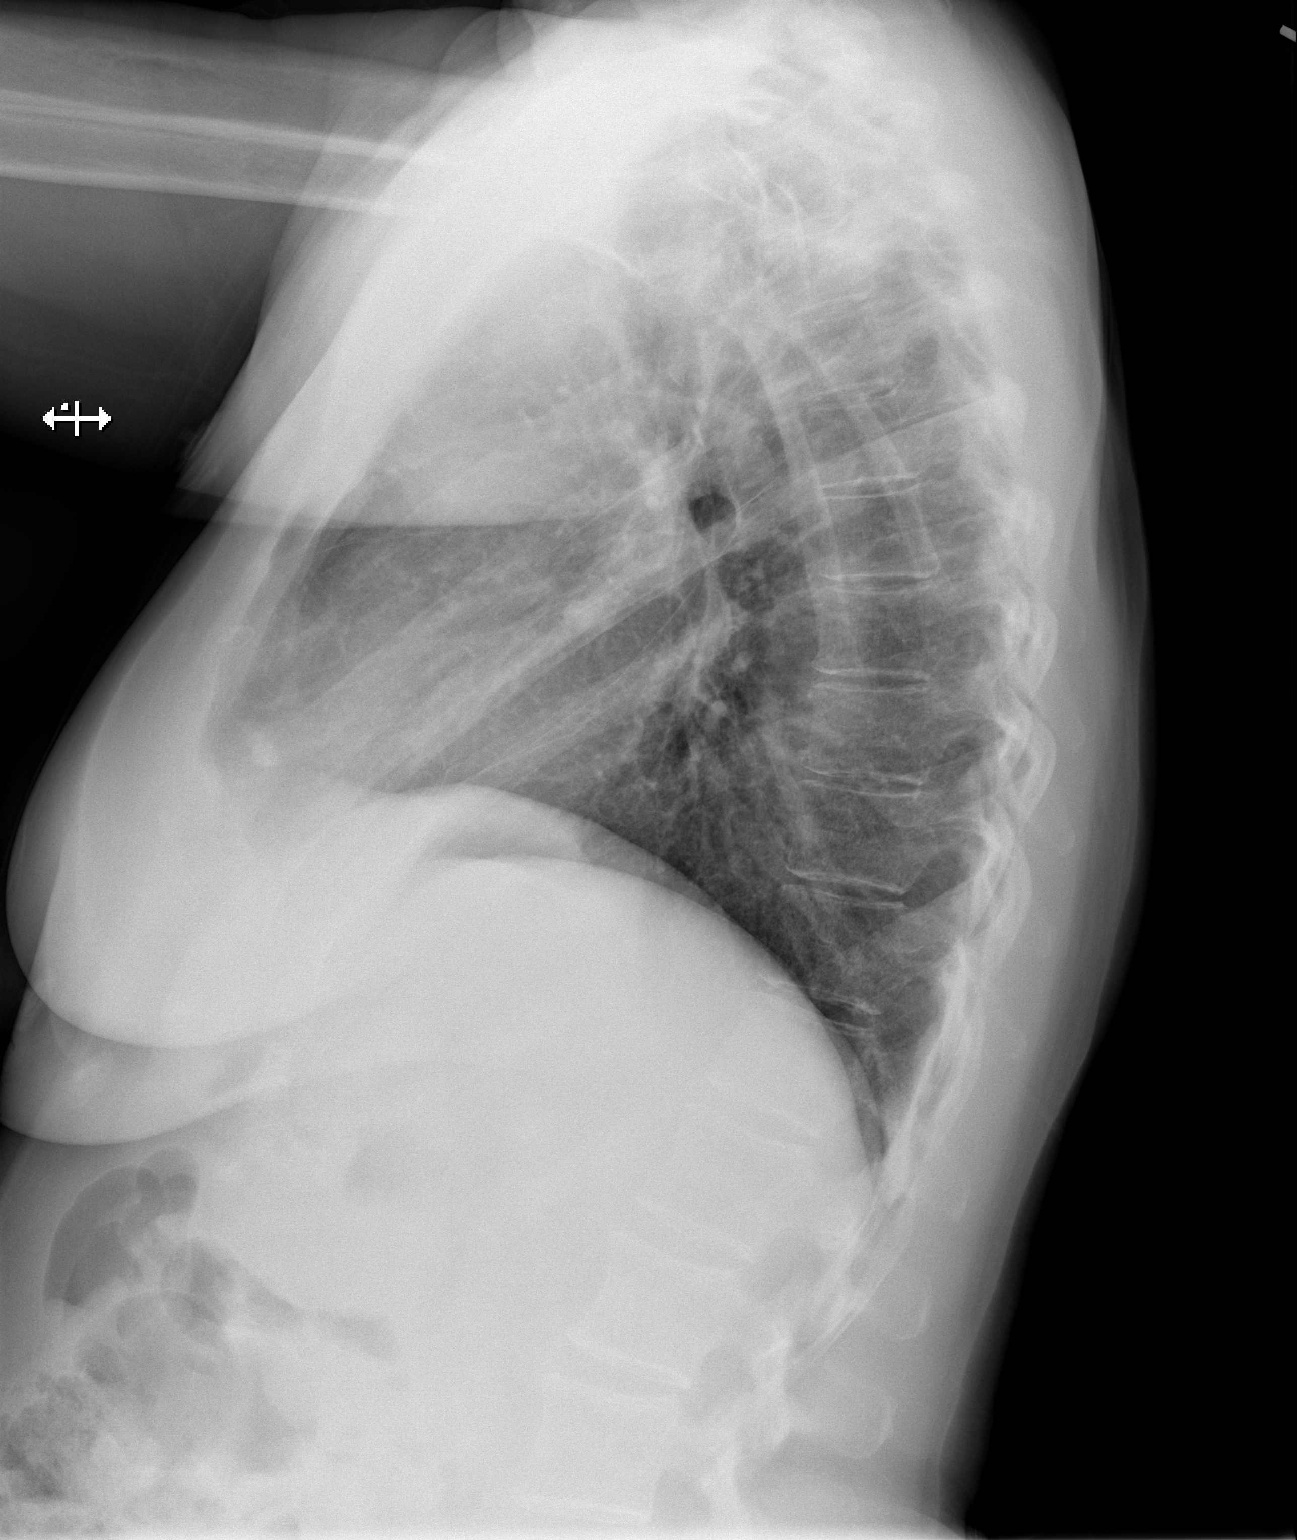

[2 of 2 positions shown; findings below may reference images not displayed]

FINDINGS: The heart and mediastinal contours are within normal limits.

Stable calcification overlying the anterior right middle lobe on
lateral view. No focal consolidation. No pulmonary edema. No pleural
effusion. No pneumothorax.

No acute osseous abnormality.
IMPRESSION: No active cardiopulmonary disease.

## 2022-11-26 ENCOUNTER — Other Ambulatory Visit: Payer: Self-pay | Admitting: Family Medicine

## 2023-02-08 ENCOUNTER — Other Ambulatory Visit: Payer: Self-pay | Admitting: Family Medicine

## 2023-02-14 ENCOUNTER — Encounter: Payer: Self-pay | Admitting: Family Medicine

## 2023-02-14 ENCOUNTER — Ambulatory Visit (INDEPENDENT_AMBULATORY_CARE_PROVIDER_SITE_OTHER): Payer: Medicare Other | Admitting: Family Medicine

## 2023-02-14 VITALS — BP 138/80 | HR 80 | Temp 97.8°F | Ht 66.5 in | Wt 168.5 lb

## 2023-02-14 DIAGNOSIS — I10 Essential (primary) hypertension: Secondary | ICD-10-CM | POA: Diagnosis not present

## 2023-02-14 DIAGNOSIS — E039 Hypothyroidism, unspecified: Secondary | ICD-10-CM

## 2023-02-14 DIAGNOSIS — R0789 Other chest pain: Secondary | ICD-10-CM | POA: Diagnosis not present

## 2023-02-14 DIAGNOSIS — Z1211 Encounter for screening for malignant neoplasm of colon: Secondary | ICD-10-CM

## 2023-02-14 DIAGNOSIS — E785 Hyperlipidemia, unspecified: Secondary | ICD-10-CM

## 2023-02-14 LAB — CBC WITH DIFFERENTIAL/PLATELET
Basophils Absolute: 0 10*3/uL (ref 0.0–0.1)
Basophils Relative: 0.8 % (ref 0.0–3.0)
Eosinophils Absolute: 0.1 10*3/uL (ref 0.0–0.7)
Eosinophils Relative: 1.7 % (ref 0.0–5.0)
HCT: 43.2 % (ref 36.0–46.0)
Hemoglobin: 14.5 g/dL (ref 12.0–15.0)
Lymphocytes Relative: 29.6 % (ref 12.0–46.0)
Lymphs Abs: 1.5 10*3/uL (ref 0.7–4.0)
MCHC: 33.6 g/dL (ref 30.0–36.0)
MCV: 93.5 fL (ref 78.0–100.0)
Monocytes Absolute: 0.5 10*3/uL (ref 0.1–1.0)
Monocytes Relative: 9.3 % (ref 3.0–12.0)
Neutro Abs: 2.9 10*3/uL (ref 1.4–7.7)
Neutrophils Relative %: 58.6 % (ref 43.0–77.0)
Platelets: 243 10*3/uL (ref 150.0–400.0)
RBC: 4.62 Mil/uL (ref 3.87–5.11)
RDW: 13.2 % (ref 11.5–15.5)
WBC: 4.9 10*3/uL (ref 4.0–10.5)

## 2023-02-14 LAB — HEPATIC FUNCTION PANEL
ALT: 15 U/L (ref 0–35)
AST: 16 U/L (ref 0–37)
Albumin: 4.5 g/dL (ref 3.5–5.2)
Alkaline Phosphatase: 54 U/L (ref 39–117)
Bilirubin, Direct: 0.1 mg/dL (ref 0.0–0.3)
Total Bilirubin: 0.5 mg/dL (ref 0.2–1.2)
Total Protein: 7.1 g/dL (ref 6.0–8.3)

## 2023-02-14 LAB — BASIC METABOLIC PANEL
BUN: 19 mg/dL (ref 6–23)
CO2: 26 meq/L (ref 19–32)
Calcium: 9.2 mg/dL (ref 8.4–10.5)
Chloride: 102 meq/L (ref 96–112)
Creatinine, Ser: 1.13 mg/dL (ref 0.40–1.20)
GFR: 50.25 mL/min — ABNORMAL LOW (ref >60.00)
Glucose, Bld: 87 mg/dL (ref 70–99)
Potassium: 4.1 meq/L (ref 3.5–5.1)
Sodium: 136 meq/L (ref 135–145)

## 2023-02-14 LAB — LIPID PANEL
Cholesterol: 236 mg/dL — ABNORMAL HIGH (ref 0–200)
HDL: 60.5 mg/dL (ref >39.00)
LDL Cholesterol: 155 mg/dL — ABNORMAL HIGH (ref 0–99)
NonHDL: 175.57
Total CHOL/HDL Ratio: 4
Triglycerides: 104 mg/dL (ref 0.0–149.0)
VLDL: 20.8 mg/dL (ref 0.0–40.0)

## 2023-02-14 LAB — TSH: TSH: 2.4 u[IU]/mL (ref 0.35–5.50)

## 2023-02-14 MED ORDER — LISINOPRIL-HYDROCHLOROTHIAZIDE 20-12.5 MG PO TABS: 1.0000 | ORAL_TABLET | Freq: Every day | ORAL | 1 refills | Status: DC

## 2023-02-14 NOTE — Patient Instructions (Signed)
Schedule your complete physical in 6 months We'll notify you of your lab results and make any changes if needed Continue to work on healthy diet and regular exercise- you can do it! Your EKG is unchanged from 2023- this is good news! Complete and return the cologuard as directed IF your chest heaviness becomes more bothersome, please let me know and we will refer you to cardiology Call with any questions or concerns Happy Valentine's Day!

## 2023-02-14 NOTE — Progress Notes (Signed)
   Subjective:    Patient ID: Audrey Glover, female    DOB: Dec 31, 1955, 68 y.o.   MRN: [id number]  HPI HTN- chronic problem, on Lisinopril 20mg  daily.  Pt reports home readings can be variable.  Will see diastolic readings in the 90s.  No CP- but 'my chest feels heavy'.  Chest heaviness is not noticed when walking or when busy.  No SOB, HA's, visual changes, edema.  Hyperlipidemia- chronic problem, on Zetia 10mg  daily.  No abd pain, N/V.  Pt is walking regularly.  Hypothyroid- chronic problem, on Levothyroxine daily.  Denies changes to skin/hair/nails.   Review of Systems For ROS see HPI     Objective:   Physical Exam Vitals reviewed.  Constitutional:      General: She is not in acute distress.    Appearance: Normal appearance. She is well-developed. She is not ill-appearing.  HENT:     Head: Normocephalic and atraumatic.  Eyes:     Conjunctiva/sclera: Conjunctivae normal.     Pupils: Pupils are equal, round, and reactive to light.  Neck:     Thyroid: No thyromegaly.  Cardiovascular:     Rate and Rhythm: Normal rate and regular rhythm.     Pulses: Normal pulses.     Heart sounds: Normal heart sounds. No murmur heard. Pulmonary:     Effort: Pulmonary effort is normal. No respiratory distress.     Breath sounds: Normal breath sounds.  Abdominal:     General: There is no distension.     Palpations: Abdomen is soft.     Tenderness: There is no abdominal tenderness.  Musculoskeletal:     Cervical back: Normal range of motion and neck supple.     Right lower leg: No edema.     Left lower leg: No edema.  Lymphadenopathy:     Cervical: No cervical adenopathy.  Skin:    General: Skin is warm and dry.  Neurological:     General: No focal deficit present.     Mental Status: She is alert and oriented to person, place, and time.  Psychiatric:        Mood and Affect: Mood normal.        Behavior: Behavior normal.           Assessment & Plan:  Chest  heaviness- new.  Pt states she's not sure if this is muscular or if it's the weight she's gained, but she does not feel this when she is walking or exerting herself- only at rest.  Denies SOB, diaphoresis, dizziness.  EKG unchanged from 2023.  She is to let me know if sxs persist and we will refer to cardiology at that time.  Pt expressed understanding and is in agreement w/ plan.

## 2023-02-14 NOTE — Assessment & Plan Note (Signed)
Chronic problem.  BP is mildly elevated today.  She reports at home it is not unusual to see diastolic readings in the 90s.  Based on this, will add hydrochlorothiazide 12.5mg  daily to her current Lisinopril 20mg  daily.  Pt expressed understanding and is in agreement w/ plan.

## 2023-02-14 NOTE — Assessment & Plan Note (Signed)
Ongoing issue for pt.  Check labs.  Adjust meds prn

## 2023-02-14 NOTE — Assessment & Plan Note (Signed)
Chronic problem.  On Zetia 10mg  daily due to statin intolerance.  She has gained some weight which is very upsetting to her.  Encouraged low carb diet and regular exercise.  Check labs.  Adjust meds prn

## 2023-02-15 ENCOUNTER — Encounter: Payer: Self-pay | Admitting: Family Medicine

## 2023-02-15 DIAGNOSIS — Z1231 Encounter for screening mammogram for malignant neoplasm of breast: Secondary | ICD-10-CM | POA: Diagnosis not present

## 2023-02-15 LAB — HM MAMMOGRAPHY

## 2023-02-16 ENCOUNTER — Telehealth: Payer: Self-pay

## 2023-02-16 NOTE — Telephone Encounter (Signed)
-----   Message from Comer Greet sent at 02/15/2023  6:47 PM EST ----- Total cholesterol and LDL (bad cholesterol) are both higher than last check.  This will improve w/ healthy diet, regular exercise, and continued use of Zetia  daily  Remainder of labs are stable and look good

## 2023-02-17 NOTE — Telephone Encounter (Signed)
 Pt has been notified and has no concerns

## 2023-03-01 ENCOUNTER — Encounter: Payer: Self-pay | Admitting: Family Medicine

## 2023-04-09 ENCOUNTER — Other Ambulatory Visit: Payer: Self-pay | Admitting: Family Medicine

## 2023-04-13 ENCOUNTER — Other Ambulatory Visit: Payer: Self-pay | Admitting: Family Medicine

## 2023-04-13 DIAGNOSIS — R7989 Other specified abnormal findings of blood chemistry: Secondary | ICD-10-CM

## 2023-04-15 ENCOUNTER — Ambulatory Visit (INDEPENDENT_AMBULATORY_CARE_PROVIDER_SITE_OTHER): Admitting: Family Medicine

## 2023-04-15 ENCOUNTER — Encounter: Payer: Self-pay | Admitting: Family Medicine

## 2023-04-15 VITALS — BP 118/80 | HR 92 | Temp 98.0°F | Wt 169.8 lb

## 2023-04-15 DIAGNOSIS — I10 Essential (primary) hypertension: Secondary | ICD-10-CM

## 2023-04-15 MED ORDER — LISINOPRIL 20 MG PO TABS: 20.0000 mg | ORAL_TABLET | Freq: Every day | ORAL | 3 refills | Status: AC

## 2023-04-15 NOTE — Progress Notes (Signed)
   Subjective:    Patient ID: Audrey Glover, female    DOB: [birth year], 68 y.o.   MRN: [id number]  HPI HTN- chronic problem.  At last visit reported that DBP was elevated in the 90s so we added hydrochlorothiazide daily.  Pt reports BP has been inconsistent.  Labs done on 4/1 showed Na 130.  Has also been having sleep issues since addition of hydrochlorothiazide.  Yesterday took a plain Lisinopril and slept fine.  BP is normal today.     Review of Systems For ROS see HPI     Objective:   Physical Exam Vitals reviewed.  Constitutional:      General: She is not in acute distress.    Appearance: Normal appearance. She is well-developed. She is not ill-appearing.  HENT:     Head: Normocephalic and atraumatic.  Eyes:     Conjunctiva/sclera: Conjunctivae normal.     Pupils: Pupils are equal, round, and reactive to light.  Neck:     Thyroid: No thyromegaly.  Cardiovascular:     Rate and Rhythm: Normal rate and regular rhythm.     Heart sounds: Normal heart sounds. No murmur heard. Pulmonary:     Effort: Pulmonary effort is normal. No respiratory distress.     Breath sounds: Normal breath sounds.  Abdominal:     General: There is no distension.     Palpations: Abdomen is soft.     Tenderness: There is no abdominal tenderness.  Musculoskeletal:     Cervical back: Normal range of motion and neck supple.  Lymphadenopathy:     Cervical: No cervical adenopathy.  Skin:    General: Skin is warm and dry.  Neurological:     General: No focal deficit present.     Mental Status: She is alert and oriented to person, place, and time.  Psychiatric:        Mood and Affect: Mood normal.        Behavior: Behavior normal.           Assessment & Plan:

## 2023-04-15 NOTE — Patient Instructions (Signed)
 Follow up in 3 months to recheck blood pressure Go back to the Lisinopril 20mg  daily Monitor you BP at home and if consistently higher than 140/85 let me know and we'll increase to 30mg  daily Keep up the good work!  You look great! Call with any questions or concerns Happy Spring!!!

## 2023-04-15 NOTE — Assessment & Plan Note (Signed)
 Pt reports she has had labile BP's and generally not felt well since starting hydrochlorothiazide at last visit.  Sodium was 130 on recent labs.  Will stop hydrochlorothiazide component and continue Lisinopril 20mg  daily.  Pt will monitor home BP and if consistently >140/85 she will let me know and we will increase Lisinopril to 30mg  daily.  Pt expressed understanding and is in agreement w/ plan.

## 2023-04-19 ENCOUNTER — Other Ambulatory Visit: Payer: Self-pay

## 2023-04-19 DIAGNOSIS — Z1211 Encounter for screening for malignant neoplasm of colon: Secondary | ICD-10-CM

## 2023-05-19 DIAGNOSIS — Z1211 Encounter for screening for malignant neoplasm of colon: Secondary | ICD-10-CM | POA: Diagnosis not present

## 2023-05-20 DIAGNOSIS — E039 Hypothyroidism, unspecified: Secondary | ICD-10-CM | POA: Diagnosis not present

## 2023-05-20 DIAGNOSIS — E785 Hyperlipidemia, unspecified: Secondary | ICD-10-CM | POA: Diagnosis not present

## 2023-05-20 DIAGNOSIS — I10 Essential (primary) hypertension: Secondary | ICD-10-CM | POA: Diagnosis not present

## 2023-05-20 DIAGNOSIS — E871 Hypo-osmolality and hyponatremia: Secondary | ICD-10-CM | POA: Diagnosis not present

## 2023-05-30 LAB — COLOGUARD: COLOGUARD: NEGATIVE

## 2023-07-18 ENCOUNTER — Ambulatory Visit (INDEPENDENT_AMBULATORY_CARE_PROVIDER_SITE_OTHER): Admitting: Family Medicine

## 2023-07-18 VITALS — BP 122/64 | HR 74 | Temp 98.7°F | Ht 66.5 in | Wt 162.1 lb

## 2023-07-18 DIAGNOSIS — E785 Hyperlipidemia, unspecified: Secondary | ICD-10-CM

## 2023-07-18 DIAGNOSIS — E039 Hypothyroidism, unspecified: Secondary | ICD-10-CM

## 2023-07-18 DIAGNOSIS — I10 Essential (primary) hypertension: Secondary | ICD-10-CM

## 2023-07-18 LAB — CBC WITH DIFFERENTIAL/PLATELET
Basophils Absolute: 0 K/uL (ref 0.0–0.1)
Basophils Relative: 1.1 % (ref 0.0–3.0)
Eosinophils Absolute: 0.1 K/uL (ref 0.0–0.7)
Eosinophils Relative: 2.5 % (ref 0.0–5.0)
HCT: 41 % (ref 36.0–46.0)
Hemoglobin: 13.9 g/dL (ref 12.0–15.0)
Lymphocytes Relative: 37.1 % (ref 12.0–46.0)
Lymphs Abs: 1.4 K/uL (ref 0.7–4.0)
MCHC: 33.9 g/dL (ref 30.0–36.0)
MCV: 91.2 fl (ref 78.0–100.0)
Monocytes Absolute: 0.4 K/uL (ref 0.1–1.0)
Monocytes Relative: 10.2 % (ref 3.0–12.0)
Neutro Abs: 1.9 K/uL (ref 1.4–7.7)
Neutrophils Relative %: 49.1 % (ref 43.0–77.0)
Platelets: 218 K/uL (ref 150.0–400.0)
RBC: 4.49 Mil/uL (ref 3.87–5.11)
RDW: 13.9 % (ref 11.5–15.5)
WBC: 3.9 K/uL — ABNORMAL LOW (ref 4.0–10.5)

## 2023-07-18 LAB — HEPATIC FUNCTION PANEL
ALT: 16 U/L (ref 0–35)
AST: 18 U/L (ref 0–37)
Albumin: 4.5 g/dL (ref 3.5–5.2)
Alkaline Phosphatase: 42 U/L (ref 39–117)
Bilirubin, Direct: 0.1 mg/dL (ref 0.0–0.3)
Total Bilirubin: 0.5 mg/dL (ref 0.2–1.2)
Total Protein: 7.1 g/dL (ref 6.0–8.3)

## 2023-07-18 LAB — BASIC METABOLIC PANEL WITH GFR
BUN: 23 mg/dL (ref 6–23)
CO2: 28 meq/L (ref 19–32)
Calcium: 9.7 mg/dL (ref 8.4–10.5)
Chloride: 104 meq/L (ref 96–112)
Creatinine, Ser: 1.21 mg/dL — ABNORMAL HIGH (ref 0.40–1.20)
GFR: 46.15 mL/min — ABNORMAL LOW (ref >60.00)
Glucose, Bld: 92 mg/dL (ref 70–99)
Potassium: 4.2 meq/L (ref 3.5–5.1)
Sodium: 138 meq/L (ref 135–145)

## 2023-07-18 LAB — LIPID PANEL
Cholesterol: 202 mg/dL — ABNORMAL HIGH (ref 0–200)
HDL: 57.6 mg/dL (ref >39.00)
LDL Cholesterol: 130 mg/dL — ABNORMAL HIGH (ref 0–99)
NonHDL: 144.73
Total CHOL/HDL Ratio: 4
Triglycerides: 75 mg/dL (ref 0.0–149.0)
VLDL: 15 mg/dL (ref 0.0–40.0)

## 2023-07-18 LAB — TSH: TSH: 3.18 u[IU]/mL (ref 0.35–5.50)

## 2023-07-18 NOTE — Assessment & Plan Note (Signed)
 Chronic problem.  On Zetia  10mg  daily due to statin intolerance.  She is down 8 lbs since last visit.  Applauded her efforts.  Check labs.  Adjust meds prn

## 2023-07-18 NOTE — Patient Instructions (Signed)
Schedule your complete physical in 6 months We'll notify you of your lab results and make any changes if needed Keep up the good work on healthy diet and regular exercise- you look great!!! Call with any questions or concerns Stay Safe!  Stay Healthy! Have a great summer!!! 

## 2023-07-18 NOTE — Assessment & Plan Note (Signed)
 Chronic problem.  On Lisinopril  20mg  daily w/ good control.  Currently asymptomatic.  Applauded her recent weight loss and regular exercise.  Check labs due to ACE use but no anticipated med changes.  Will follow.

## 2023-07-18 NOTE — Progress Notes (Signed)
   Subjective:    Patient ID: Audrey Glover, female    DOB: 1955/01/16, 68 y.o.   MRN: [id number]  HPI HTN- chronic problem, on Lisinopril  20mg  daily w/ good control.  Exercising regularly.  No CP, SOB, HA's, visual changes, edema.  Hyperlipidemia- chronic problem, on Zetia  10mg  daily.  Pt is down 8 lbs since April.  No abd pain, N/V.  Hypothyroid- chronic problem, on Levothyroxine  50mcg daily.  Energy level is stable, no changes to skin/hair/nails.   Review of Systems For ROS see HPI     Objective:   Physical Exam        Assessment & Plan:

## 2023-07-18 NOTE — Assessment & Plan Note (Signed)
 Chronic problem, on levothyroxine  50mcg daily.  Currently asymptomatic.  Check labs.  Adjust meds prn

## 2023-07-19 ENCOUNTER — Ambulatory Visit: Payer: Self-pay | Admitting: Family Medicine

## 2023-07-29 DIAGNOSIS — E785 Hyperlipidemia, unspecified: Secondary | ICD-10-CM | POA: Diagnosis not present

## 2023-07-29 DIAGNOSIS — E559 Vitamin D deficiency, unspecified: Secondary | ICD-10-CM | POA: Diagnosis not present

## 2023-07-29 DIAGNOSIS — K219 Gastro-esophageal reflux disease without esophagitis: Secondary | ICD-10-CM | POA: Diagnosis not present

## 2023-07-29 DIAGNOSIS — R5383 Other fatigue: Secondary | ICD-10-CM | POA: Diagnosis not present

## 2023-08-05 DIAGNOSIS — R5383 Other fatigue: Secondary | ICD-10-CM | POA: Diagnosis not present

## 2023-08-05 DIAGNOSIS — E559 Vitamin D deficiency, unspecified: Secondary | ICD-10-CM | POA: Diagnosis not present

## 2023-08-24 ENCOUNTER — Encounter: Payer: Medicare Other | Admitting: Family Medicine

## 2023-08-30 ENCOUNTER — Ambulatory Visit (INDEPENDENT_AMBULATORY_CARE_PROVIDER_SITE_OTHER): Admitting: *Deleted

## 2023-08-30 VITALS — Ht 65.0 in | Wt 154.0 lb

## 2023-08-30 DIAGNOSIS — Z0000 Encounter for general adult medical examination without abnormal findings: Secondary | ICD-10-CM | POA: Diagnosis not present

## 2023-08-30 NOTE — Progress Notes (Signed)
 Subjective:   Audrey Glover is a 68 y.o. female who presents for Medicare Annual (Subsequent) preventive examination.  Visit Complete: Virtual I connected with  Audrey Glover on 08/30/23 by a audio enabled telemedicine application and verified that I am speaking with the correct person using two identifiers.  Patient Location: Home  Provider Location: Home Office  I discussed the limitations of evaluation and management by telemedicine. The patient expressed understanding and agreed to proceed.  Vital Signs: Because this visit was a virtual/telehealth visit, some criteria may be missing or patient reported. Any vitals not documented were not able to be obtained and vitals that have been documented are patient reported.  Patient Medicare AWV questionnaire was completed by the patient on 08-29-2023; I have confirmed that all information answered by patient is correct and no changes since this date.  Cardiac Risk Factors include: advanced age (>46men, >60 women);obesity (BMI >30kg/m2);hypertension     Objective:    Today's Vitals   08/30/23 0955  Weight: 154 lb (69.9 kg)  Height: 5' 5 (1.651 m)   Body mass index is 25.63 kg/m.     08/30/2023    9:35 AM  Advanced Directives  Does Patient Have a Medical Advance Directive? No  Would patient like information on creating a medical advance directive? No - Patient declined    Current Medications (verified) Outpatient Encounter Medications as of 08/30/2023  Medication Sig   albuterol  (VENTOLIN  HFA) 108 (90 Base) MCG/ACT inhaler Inhale 2 puffs into the lungs every 6 (six) hours as needed for wheezing or shortness of breath.   ezetimibe  (ZETIA ) 10 MG tablet TAKE 1 TABLET BY MOUTH EVERY DAY   fish oil-omega-3 fatty acids 1000 MG capsule Take 1 g by mouth every morning.   levothyroxine  (SYNTHROID ) 50 MCG tablet TAKE 1 TABLET BY MOUTH EVERY DAY   lisinopril  (ZESTRIL ) 20 MG tablet Take 1 tablet (20 mg total) by mouth  daily.   Multiple Vitamins-Minerals (MULTIVITAMIN ADULT PO) Take by mouth.   omeprazole (PRILOSEC) 20 MG capsule    Propylene Glycol (SYSTANE COMPLETE OP)    No facility-administered encounter medications on file as of 08/30/2023.    Allergies (verified) Statins and Sulfa antibiotics   History: Past Medical History:  Diagnosis Date   Allergy    Anemia    Hypertension    Inverted nipple    normal for her, always been like that she says   Past Surgical History:  Procedure Laterality Date   BREAST BIOPSY Right 2021   BREAST CYST ASPIRATION  07/22/2000   uterine fibroid removed     Family History  Problem Relation Age of Onset   Hypertension Mother    Heart disease Father    Hypertension Father    Breast cancer Neg Hx    Social History   Socioeconomic History   Marital status: Divorced    Spouse name: Not on file   Number of children: Not on file   Years of education: Not on file   Highest education level: Bachelor's degree (e.g., BA, AB, BS)  Occupational History   Not on file  Tobacco Use   Smoking status: Former   Smokeless tobacco: Never  Vaping Use   Vaping status: Never Used  Substance and Sexual Activity   Alcohol use: Yes    Alcohol/week: 2.0 standard drinks of alcohol    Types: 2 Glasses of wine per week    Comment: Social   Drug use: No   Sexual activity: Never  Other Topics Concern   Not on file  Social History Narrative   Not on file   Social Drivers of Health   Financial Resource Strain: Low Risk  (08/30/2023)   Overall Financial Resource Strain (CARDIA)    Difficulty of Paying Living Expenses: Not very hard  Food Insecurity: No Food Insecurity (08/30/2023)   Hunger Vital Sign    Worried About Running Out of Food in the Last Year: Never true    Ran Out of Food in the Last Year: Never true  Transportation Needs: No Transportation Needs (08/30/2023)   PRAPARE - Administrator, Civil Service (Medical): No    Lack of Transportation  (Non-Medical): No  Physical Activity: Insufficiently Active (08/30/2023)   Exercise Vital Sign    Days of Exercise per Week: 4 days    Minutes of Exercise per Session: 30 min  Stress: No Stress Concern Present (08/30/2023)   [employer] of Occupational Health - Occupational Stress Questionnaire    Feeling of Stress: Only a little  Social Connections: Moderately Integrated (08/30/2023)   Social Connection and Isolation Panel    Frequency of Communication with Friends and Family: Three times a week    Frequency of Social Gatherings with Friends and Family: Three times a week    Attends Religious Services: More than 4 times per year    Active Member of Clubs or Organizations: Yes    Attends Engineer, structural: More than 4 times per year    Marital Status: Divorced    Tobacco Counseling Counseling given: Not Answered   Clinical Intake:  Pre-visit preparation completed: Yes  Pain : No/denies pain     Diabetes: No  How often do you need to have someone help you when you read instructions, pamphlets, or other written materials from your doctor or pharmacy?: 1 - Never  Interpreter Needed?: No  Information entered by :: Mliss Graff LPN   Activities of Daily Living    08/30/2023   10:38 AM 08/29/2023    8:50 PM  In your present state of health, do you have any difficulty performing the following activities:  Hearing? 0 0  Vision? 0 0  Difficulty concentrating or making decisions? 0 0  Walking or climbing stairs? 0 0  Dressing or bathing? 0 0  Doing errands, shopping? 0 0  Preparing Food and eating ? N N  Using the Toilet? N N  In the past six months, have you accidently leaked urine? N N  Do you have problems with loss of bowel control? N N  Managing your Medications? N N  Managing your Finances? N N  Housekeeping or managing your Housekeeping? N N    Patient Care Team: Tabori, Katherine E, MD as PCP - General (Family Medicine) Lavoie, Marie-Lyne, MD as  Consulting Physician (Obstetrics and Gynecology)  Indicate any recent Medical Services you may have received from other than [facility] providers in the past year (date may be approximate).     Assessment:   This is a routine wellness examination for Audrey Glover.  Hearing/Vision screen Hearing Screening - Comments:: No trouble hearing Vision Screening - Comments::  [facility] [address] [place], [place] [address] [phone] [facility]     Goals Addressed             This Visit's Progress    Weight (lb) < 200 lb (90.7 kg)   154 lb (69.9 kg)      Depression Screen  08/30/2023    9:56 AM 07/18/2023    9:14 AM 04/15/2023   11:27 AM 02/14/2023    8:47 AM 08/13/2022    8:37 AM 02/25/2022    9:13 AM 02/10/2022    8:43 AM  PHQ 2/9 Scores  PHQ - 2 Score 0 0 0 0 0 0 0  PHQ- 9 Score 0 0 1 2 0 0 2    Fall Risk    08/30/2023   10:38 AM 08/29/2023    8:50 PM 07/18/2023    9:08 AM 04/15/2023   11:27 AM 02/14/2023    8:47 AM  Fall Risk   Falls in the past year? 1 1 0 1 1  Number falls in past yr: 0 0 0 0 0  Injury with Fall? 0 0 0 0 1  Risk for fall due to :   No Fall Risks History of fall(s)   Follow up Falls evaluation completed;Education provided;Falls prevention discussed  Falls evaluation completed Falls evaluation completed     MEDICARE RISK AT HOME: Medicare Risk at Home Any stairs in or around the home?: No If so, are there any without handrails?: No Home free of loose throw rugs in walkways, pet beds, electrical cords, etc?: Yes Adequate lighting in your home to reduce risk of falls?: Yes Life alert?: No Use of a cane, walker or w/c?: No Grab bars in the bathroom?: Yes Shower chair or bench in shower?: No Elevated toilet seat or a handicapped toilet?: No  TIMED UP AND GO:  Was the test performed?  No    Cognitive Function:        08/30/2023    9:34 AM  6CIT Screen  What Year? 0 points  What month? 0 points  What time? 0  points  Count back from 20 0 points  Months in reverse 0 points  Repeat phrase 0 points  Total Score 0 points    Immunizations Immunization History  Administered Date(s) Administered   Fluad Quad(high Dose 65+) 11/20/2021   Influenza, High Dose Seasonal PF 10/03/2020, 10/18/2022   Influenza,inj,Quad PF,6+ Mos 10/04/2012, 09/20/2013, 12/30/2014, 12/02/2015, 10/21/2017, 10/20/2018, 10/22/2019   Influenza-Unspecified 10/18/2016, 11/20/2021   PFIZER Comirnaty(Gray Top)Covid-19 Tri-Sucrose Vaccine 06/24/2020   PFIZER(Purple Top)SARS-COV-2 Vaccination 03/27/2019, 04/17/2019, 12/20/2019   PNEUMOCOCCAL CONJUGATE-20 08/07/2021   Pfizer Covid-19 Vaccine Bivalent Booster 56yrs & up 11/14/2020, 10/19/2021   Tdap 01/25/2002, 10/04/2012, 02/28/2023   Zoster, Live 07/22/2015    TDAP status: Up to date  Flu Vaccine status: Due, Education has been provided regarding the importance of this vaccine. Advised may receive this vaccine at local pharmacy or Health Dept. Aware to provide a copy of the vaccination record if obtained from local pharmacy or Health Dept. Verbalized acceptance and understanding.  Pneumococcal vaccine status: Up to date  Covid-19 vaccine status: Information provided on how to obtain vaccines.   Qualifies for Shingles Vaccine? Yes   Zostavax completed No   Shingrix Completed?: Yes  Screening Tests Health Maintenance  Topic Date Due   Zoster Vaccines- Shingrix (1 of 2) 05/05/1974   COVID-19 Vaccine (7 - 2024-25 season) 09/12/2022   INFLUENZA VACCINE  08/12/2023   MAMMOGRAM  02/15/2024   Medicare Annual Wellness (AWV)  08/29/2024   Fecal DNA (Cologuard)  05/19/2026   DTaP/Tdap/Td (4 - Td or Tdap) 02/27/2033   Pneumococcal Vaccine: 50+ Years  Completed   DEXA SCAN  Completed   Hepatitis C Screening  Completed   HPV VACCINES  Aged Out   Meningococcal B Vaccine  Aged Out   Colonoscopy  Discontinued    Health Maintenance  Health Maintenance Due  Topic Date Due    Zoster Vaccines- Shingrix (1 of 2) 05/05/1974   COVID-19 Vaccine (7 - 2024-25 season) 09/12/2022   INFLUENZA VACCINE  08/12/2023    Colorectal cancer screening: Type of screening: Cologuard. Completed 2025. Repeat every 3 years  Mammogram status: Completed  . Repeat every year  Bone Density status: Completed 2023. Results reflect: Bone density results: OSTEOPENIA. Repeat every 3 years.  Lung Cancer Screening: (Low Dose CT Chest recommended if Age 31-80 years, 20 pack-year currently smoking OR have quit w/in 15years.) does not qualify.   Lung Cancer Screening Referral:   Additional Screening:  Hepatitis C Screening: does not qualify; Completed 2023  Vision Screening: Recommended annual ophthalmology exams for early detection of glaucoma and other disorders of the eye. Is the patient up to date with their annual eye exam?  Yes  Who is the provider or what is the name of the office in which the patient attends annual eye exams?   [facility]   If pt is not established with a provider, would they like to be referred to a provider to establish care? No .   Dental Screening: Recommended annual dental exams for proper oral hygiene   Community Resource Referral / Chronic Care Management: CRR required this visit?  No   CCM required this visit?  No     Plan:     I have personally reviewed and noted the following in the patient's chart:   Medical and social history Use of alcohol, tobacco or illicit drugs  Current medications and supplements including opioid prescriptions. Patient is not currently taking opioid prescriptions. Functional ability and status Nutritional status Physical activity Advanced directives List of other physicians Hospitalizations, surgeries, and ER visits in previous 12 months Vitals Screenings to include cognitive, depression, and falls Referrals and appointments  In addition, I have reviewed and discussed with patient certain preventive protocols,  quality metrics, and best practice recommendations. A written personalized care plan for preventive services as well as general preventive health recommendations were provided to patient.     Mliss Graff, LPN   1/80/7974   After Visit Summary: (MyChart) Due to this being a telephonic visit, the after visit summary with patients personalized plan was offered to patient via MyChart   Nurse Notes:

## 2023-08-30 NOTE — Patient Instructions (Signed)
 Audrey Glover , Thank you for taking time to come for your Medicare Wellness Visit. I appreciate your ongoing commitment to your health goals. Please review the following plan we discussed and let me know if I can assist you in the future.   Screening recommendations/referrals: Colonoscopy Mammogram:  Bone Density:  Recommended yearly ophthalmology/optometry visit for glaucoma screening and checkup Recommended yearly dental visit for hygiene and checkup  Vaccinations: Influenza vaccine:  Pneumococcal vaccine:  Tdap vaccine:  Shingles vaccine:       Preventive Care 65 Years and Older, Female Preventive care refers to lifestyle choices and visits with your health care provider that can promote health and wellness. What does preventive care include? A yearly physical exam. This is also called an annual well check. Dental exams once or twice a year. Routine eye exams. Ask your health care provider how often you should have your eyes checked. Personal lifestyle choices, including: Daily care of your teeth and gums. Regular physical activity. Eating a healthy diet. Avoiding tobacco and drug use. Limiting alcohol use. Practicing safe sex. Taking low-dose aspirin every day. Taking vitamin and mineral supplements as recommended by your health care provider. What happens during an annual well check? The services and screenings done by your health care provider during your annual well check will depend on your age, overall health, lifestyle risk factors, and family history of disease. Counseling  Your health care provider may ask you questions about your: Alcohol use. Tobacco use. Drug use. Emotional well-being. Home and relationship well-being. Sexual activity. Eating habits. History of falls. Memory and ability to understand (cognition). Work and work Astronomer. Reproductive health. Screening  You may have the following tests or measurements: Height, weight, and  BMI. Blood pressure. Lipid and cholesterol levels. These may be checked every 5 years, or more frequently if you are over 28 years old. Skin check. Lung cancer screening. You may have this screening every year starting at age 39 if you have a 30-pack-year history of smoking and currently smoke or have quit within the past 15 years. Fecal occult blood test (FOBT) of the stool. You may have this test every year starting at age 69. Flexible sigmoidoscopy or colonoscopy. You may have a sigmoidoscopy every 5 years or a colonoscopy every 10 years starting at age 55. Hepatitis C blood test. Hepatitis B blood test. Sexually transmitted disease (STD) testing. Diabetes screening. This is done by checking your blood sugar (glucose) after you have not eaten for a while (fasting). You may have this done every 1-3 years. Bone density scan. This is done to screen for osteoporosis. You may have this done starting at age 75. Mammogram. This may be done every 1-2 years. Talk to your health care provider about how often you should have regular mammograms. Talk with your health care provider about your test results, treatment options, and if necessary, the need for more tests. Vaccines  Your health care provider may recommend certain vaccines, such as: Influenza vaccine. This is recommended every year. Tetanus, diphtheria, and acellular pertussis (Tdap, Td) vaccine. You may need a Td booster every 10 years. Zoster vaccine. You may need this after age 21. Pneumococcal 13-valent conjugate (PCV13) vaccine. One dose is recommended after age 33. Pneumococcal polysaccharide (PPSV23) vaccine. One dose is recommended after age 9. Talk to your health care provider about which screenings and vaccines you need and how often you need them. This information is not intended to replace advice given to you by your health care provider.  Make sure you discuss any questions you have with your health care provider. Document  Released: 01/24/2015 Document Revised: 09/17/2015 Document Reviewed: 10/29/2014 Elsevier Interactive Patient Education  2017 [employer].  Fall Prevention in the Home Falls can cause injuries. They can happen to people of all ages. There are many things you can do to make your home safe and to help prevent falls. What can I do on the outside of my home? Regularly fix the edges of walkways and driveways and fix any cracks. Remove anything that might make you trip as you walk through a door, such as a raised step or threshold. Trim any bushes or trees on the path to your home. Use bright outdoor lighting. Clear any walking paths of anything that might make someone trip, such as rocks or tools. Regularly check to see if handrails are loose or broken. Make sure that both sides of any steps have handrails. Any raised decks and porches should have guardrails on the edges. Have any leaves, snow, or ice cleared regularly. Use sand or salt on walking paths during winter. Clean up any spills in your garage right away. This includes oil or grease spills. What can I do in the bathroom? Use night lights. Install grab bars by the toilet and in the tub and shower. Do not use towel bars as grab bars. Use non-skid mats or decals in the tub or shower. If you need to sit down in the shower, use a plastic, non-slip stool. Keep the floor dry. Clean up any water that spills on the floor as soon as it happens. Remove soap buildup in the tub or shower regularly. Attach bath mats securely with double-sided non-slip rug tape. Do not have throw rugs and other things on the floor that can make you trip. What can I do in the bedroom? Use night lights. Make sure that you have a light by your bed that is easy to reach. Do not use any sheets or blankets that are too big for your bed. They should not hang down onto the floor. Have a firm chair that has side arms. You can use this for support while you get dressed. Do  not have throw rugs and other things on the floor that can make you trip. What can I do in the kitchen? Clean up any spills right away. Avoid walking on wet floors. Keep items that you use a lot in easy-to-reach places. If you need to reach something above you, use a strong step stool that has a grab bar. Keep electrical cords out of the way. Do not use floor polish or wax that makes floors slippery. If you must use wax, use non-skid floor wax. Do not have throw rugs and other things on the floor that can make you trip. What can I do with my stairs? Do not leave any items on the stairs. Make sure that there are handrails on both sides of the stairs and use them. Fix handrails that are broken or loose. Make sure that handrails are as long as the stairways. Check any carpeting to make sure that it is firmly attached to the stairs. Fix any carpet that is loose or worn. Avoid having throw rugs at the top or bottom of the stairs. If you do have throw rugs, attach them to the floor with carpet tape. Make sure that you have a light switch at the top of the stairs and the bottom of the stairs. If you do not have them,  ask someone to add them for you. What else can I do to help prevent falls? Wear shoes that: Do not have high heels. Have rubber bottoms. Are comfortable and fit you well. Are closed at the toe. Do not wear sandals. If you use a stepladder: Make sure that it is fully opened. Do not climb a closed stepladder. Make sure that both sides of the stepladder are locked into place. Ask someone to hold it for you, if possible. Clearly mark and make sure that you can see: Any grab bars or handrails. First and last steps. Where the edge of each step is. Use tools that help you move around (mobility aids) if they are needed. These include: Canes. Walkers. Scooters. Crutches. Turn on the lights when you go into a dark area. Replace any light bulbs as soon as they burn out. Set up your  furniture so you have a clear path. Avoid moving your furniture around. If any of your floors are uneven, fix them. If there are any pets around you, be aware of where they are. Review your medicines with your doctor. Some medicines can make you feel dizzy. This can increase your chance of falling. Ask your doctor what other things that you can do to help prevent falls. This information is not intended to replace advice given to you by your health care provider. Make sure you discuss any questions you have with your health care provider. Document Released: 10/24/2008 Document Revised: 06/05/2015 Document Reviewed: 02/01/2014 Elsevier Interactive Patient Education  2017 [employer].

## 2023-10-12 ENCOUNTER — Other Ambulatory Visit: Payer: Self-pay | Admitting: Family Medicine

## 2023-10-12 DIAGNOSIS — R7989 Other specified abnormal findings of blood chemistry: Secondary | ICD-10-CM

## 2023-10-19 DIAGNOSIS — H0288A Meibomian gland dysfunction right eye, upper and lower eyelids: Secondary | ICD-10-CM | POA: Diagnosis not present

## 2023-10-19 DIAGNOSIS — H2513 Age-related nuclear cataract, bilateral: Secondary | ICD-10-CM | POA: Diagnosis not present

## 2023-10-19 DIAGNOSIS — L718 Other rosacea: Secondary | ICD-10-CM | POA: Diagnosis not present

## 2023-10-19 DIAGNOSIS — H0288B Meibomian gland dysfunction left eye, upper and lower eyelids: Secondary | ICD-10-CM | POA: Diagnosis not present

## 2023-11-04 DIAGNOSIS — I10 Essential (primary) hypertension: Secondary | ICD-10-CM | POA: Diagnosis not present

## 2023-11-04 DIAGNOSIS — E785 Hyperlipidemia, unspecified: Secondary | ICD-10-CM | POA: Diagnosis not present

## 2023-11-04 DIAGNOSIS — Z6824 Body mass index (BMI) 24.0-24.9, adult: Secondary | ICD-10-CM | POA: Diagnosis not present

## 2023-12-20 ENCOUNTER — Ambulatory Visit: Admitting: Family Medicine

## 2023-12-20 ENCOUNTER — Encounter: Payer: Self-pay | Admitting: Family Medicine

## 2023-12-20 VITALS — BP 122/74 | HR 74 | Temp 98.0°F | Ht 65.0 in | Wt 150.2 lb

## 2023-12-20 DIAGNOSIS — I10 Essential (primary) hypertension: Secondary | ICD-10-CM

## 2023-12-20 DIAGNOSIS — Z0000 Encounter for general adult medical examination without abnormal findings: Secondary | ICD-10-CM

## 2023-12-20 DIAGNOSIS — E559 Vitamin D deficiency, unspecified: Secondary | ICD-10-CM

## 2023-12-20 LAB — CBC WITH DIFFERENTIAL/PLATELET
Basophils Absolute: 0 K/uL (ref 0.0–0.1)
Basophils Relative: 0.8 % (ref 0.0–3.0)
Eosinophils Absolute: 0.1 K/uL (ref 0.0–0.7)
Eosinophils Relative: 1.6 % (ref 0.0–5.0)
HCT: 41.9 % (ref 36.0–46.0)
Hemoglobin: 14.2 g/dL (ref 12.0–15.0)
Lymphocytes Relative: 34.9 % (ref 12.0–46.0)
Lymphs Abs: 1.6 K/uL (ref 0.7–4.0)
MCHC: 33.8 g/dL (ref 30.0–36.0)
MCV: 92.5 fl (ref 78.0–100.0)
Monocytes Absolute: 0.4 K/uL (ref 0.1–1.0)
Monocytes Relative: 9 % (ref 3.0–12.0)
Neutro Abs: 2.4 K/uL (ref 1.4–7.7)
Neutrophils Relative %: 53.7 % (ref 43.0–77.0)
Platelets: 206 K/uL (ref 150.0–400.0)
RBC: 4.53 Mil/uL (ref 3.87–5.11)
RDW: 13.6 % (ref 11.5–15.5)
WBC: 4.5 K/uL (ref 4.0–10.5)

## 2023-12-20 LAB — BASIC METABOLIC PANEL WITH GFR
BUN: 23 mg/dL (ref 6–23)
CO2: 26 meq/L (ref 19–32)
Calcium: 9.3 mg/dL (ref 8.4–10.5)
Chloride: 102 meq/L (ref 96–112)
Creatinine, Ser: 0.99 mg/dL (ref 0.40–1.20)
GFR: 58.54 mL/min — ABNORMAL LOW (ref >60.00)
Glucose, Bld: 77 mg/dL (ref 70–99)
Potassium: 4.2 meq/L (ref 3.5–5.1)
Sodium: 137 meq/L (ref 135–145)

## 2023-12-20 LAB — LIPID PANEL
Cholesterol: 181 mg/dL (ref 0–200)
HDL: 59.8 mg/dL (ref >39.00)
LDL Cholesterol: 107 mg/dL — ABNORMAL HIGH (ref 0–99)
NonHDL: 121.1
Total CHOL/HDL Ratio: 3
Triglycerides: 70 mg/dL (ref 0.0–149.0)
VLDL: 14 mg/dL (ref 0.0–40.0)

## 2023-12-20 LAB — HEPATIC FUNCTION PANEL
ALT: 19 U/L (ref 0–35)
AST: 20 U/L (ref 0–37)
Albumin: 4.5 g/dL (ref 3.5–5.2)
Alkaline Phosphatase: 48 U/L (ref 39–117)
Bilirubin, Direct: 0.1 mg/dL (ref 0.0–0.3)
Total Bilirubin: 0.6 mg/dL (ref 0.2–1.2)
Total Protein: 6.9 g/dL (ref 6.0–8.3)

## 2023-12-20 LAB — TSH: TSH: 2.61 u[IU]/mL (ref 0.35–5.50)

## 2023-12-20 LAB — VITAMIN D 25 HYDROXY (VIT D DEFICIENCY, FRACTURES): VITD: 73.8 ng/mL (ref 30.00–100.00)

## 2023-12-20 MED ORDER — AZELASTINE HCL 0.1 % NA SOLN: 1.0000 | Freq: Two times a day (BID) | NASAL | 5 refills | Status: AC

## 2023-12-20 NOTE — Patient Instructions (Addendum)
 Follow up in 6 months to recheck BP, cholesterol, thyroid  We'll notify you of your lab results and make any changes if needed Keep up the good work on healthy diet and regular exercise- you look great! START the Azelastine  nasal spray to help w/ the runny nose Call with any questions or concerns Stay Safe!  Stay Healthy! Happy Holidays!!

## 2023-12-20 NOTE — Assessment & Plan Note (Signed)
 Pt's PE WNL.  UTD on mammo, cologuard, Tdap, PNA, flu.  Check labs.  Anticipatory guidance provided.

## 2023-12-20 NOTE — Progress Notes (Signed)
   Subjective:    Patient ID: Audrey Glover, female    DOB: Jun 07, 1955, 68 y.o.   MRN: [id number]  HPI CPE- UTD on mammo, cologuard, Tdap, PNA, flu.  Pt reports feeling good.  Patient Care Team    Relationship Specialty Notifications Start End  Mahlon Comer BRAVO, MD PCP - General Family Medicine  05/29/13   Audrey Glover Audrey    12/20/23     Health Maintenance  Topic Date Due   Zoster Vaccines- Shingrix (1 of 2) 05/05/1974   Influenza Vaccine  08/12/2023   COVID-19 Vaccine (7 - 2025-26 season) 09/12/2023   Mammogram  02/15/2024   Medicare Annual Wellness (AWV)  08/29/2024   Fecal DNA (Cologuard)  05/19/2026   DTaP/Tdap/Td (4 - Td or Tdap) 02/27/2033   Pneumococcal Vaccine: 50+ Years  Completed   Bone Density Scan  Completed   Hepatitis C Screening  Completed   Meningococcal B Vaccine  Aged Out   Colonoscopy  Discontinued      Review of Systems Patient reports no vision/hearing changes, adenopathy,fever, weight change,  persistant/recurrent hoarseness , swallowing issues, chest pain, palpitations, edema, persistant/recurrent cough, hemoptysis, dyspnea (rest/exertional/paroxysmal nocturnal), gastrointestinal bleeding (melena, rectal bleeding), abdominal pain, significant heartburn, bowel changes, GU symptoms (dysuria, hematuria, incontinence), Gyn symptoms (abnormal  bleeding, pain),  syncope, focal weakness, memory loss, numbness & tingling, skin/hair/nail changes, abnormal bruising or bleeding, anxiety, or depression.     Objective:   Physical Exam General Appearance:    Alert, cooperative, no distress, appears stated age  Head:    Normocephalic, without obvious abnormality, atraumatic  Eyes:    PERRL, conjunctiva/corneas clear, EOM's intact both eyes  Ears:    Normal TM's and external ear canals, both ears  Nose:   Nares normal, septum midline, mucosa normal, no drainage    or sinus tenderness  Throat:   Lips, mucosa, and tongue normal; teeth and gums normal   Neck:   Supple, symmetrical, trachea midline, no adenopathy;    Thyroid : no enlargement/tenderness/nodules  Back:     Symmetric, no curvature, ROM normal, no CVA tenderness  Lungs:     Clear to auscultation bilaterally, respirations unlabored  Chest Wall:    No tenderness or deformity   Heart:    Regular rate and rhythm, S1 and S2 normal, no murmur, rub   or gallop  Breast Exam:    Deferred to mammo  Abdomen:     Soft, non-tender, bowel sounds active all four quadrants,    no masses, no organomegaly  Genitalia:    Deferred to GYN  Rectal:    Extremities:   Extremities normal, atraumatic, no cyanosis or edema  Pulses:   2+ and symmetric all extremities  Skin:   Skin color, texture, turgor normal, no rashes or lesions  Lymph nodes:   Cervical, supraclavicular, and axillary nodes normal  Neurologic:   CNII-XII intact, normal strength, sensation and reflexes    throughout          Assessment & Plan:

## 2023-12-21 ENCOUNTER — Ambulatory Visit: Payer: Self-pay | Admitting: Family Medicine

## 2023-12-21 NOTE — Progress Notes (Signed)
 Lab results have been discussed.   Verbalized understanding? Yes  Are there any questions? No

## 2024-06-20 ENCOUNTER — Ambulatory Visit: Admitting: Family Medicine

## 2024-09-11 ENCOUNTER — Encounter
